# Patient Record
Sex: Female | Born: 1955 | Race: White | Hispanic: No | Marital: Married | Smoking: Never smoker
Health system: Southern US, Community
[De-identification: ages and names within clinical notes are randomized; demographics above are authoritative.]

## PROBLEM LIST (undated history)

## (undated) DIAGNOSIS — R9430 Abnormal result of cardiovascular function study, unspecified: Secondary | ICD-10-CM

## (undated) DIAGNOSIS — I2510 Atherosclerotic heart disease of native coronary artery without angina pectoris: Secondary | ICD-10-CM

## (undated) DIAGNOSIS — IMO0002 Reserved for concepts with insufficient information to code with codable children: Secondary | ICD-10-CM

## (undated) DIAGNOSIS — L409 Psoriasis, unspecified: Secondary | ICD-10-CM

## (undated) DIAGNOSIS — E785 Hyperlipidemia, unspecified: Secondary | ICD-10-CM

## (undated) DIAGNOSIS — N200 Calculus of kidney: Secondary | ICD-10-CM

## (undated) DIAGNOSIS — K9041 Non-celiac gluten sensitivity: Secondary | ICD-10-CM

## (undated) DIAGNOSIS — K317 Polyp of stomach and duodenum: Secondary | ICD-10-CM

## (undated) HISTORY — DX: Abnormal result of cardiovascular function study, unspecified: R94.30

## (undated) HISTORY — DX: Hyperlipidemia, unspecified: E78.5

## (undated) HISTORY — DX: Calculus of kidney: N20.0

## (undated) HISTORY — DX: Atherosclerotic heart disease of native coronary artery without angina pectoris: I25.10

## (undated) HISTORY — DX: Psoriasis, unspecified: L40.9

## (undated) HISTORY — DX: Non-celiac gluten sensitivity: K90.41

## (undated) HISTORY — DX: Polyp of stomach and duodenum: K31.7

## (undated) HISTORY — DX: Reserved for concepts with insufficient information to code with codable children: IMO0002

---

## 1997-06-26 ENCOUNTER — Other Ambulatory Visit: Admission: RE | Admit: 1997-06-26 | Discharge: 1997-06-26 | Payer: Self-pay | Admitting: Obstetrics and Gynecology

## 1997-07-10 ENCOUNTER — Ambulatory Visit (HOSPITAL_COMMUNITY): Admission: RE | Admit: 1997-07-10 | Discharge: 1997-07-10 | Payer: Self-pay | Admitting: Obstetrics and Gynecology

## 1997-11-15 ENCOUNTER — Ambulatory Visit: Admission: RE | Admit: 1997-11-15 | Discharge: 1997-11-15 | Payer: Self-pay | Admitting: Internal Medicine

## 1998-08-01 ENCOUNTER — Ambulatory Visit (HOSPITAL_COMMUNITY): Admission: RE | Admit: 1998-08-01 | Discharge: 1998-08-01 | Payer: Self-pay | Admitting: Obstetrics and Gynecology

## 1998-08-01 ENCOUNTER — Encounter: Payer: Self-pay | Admitting: Obstetrics and Gynecology

## 1999-11-12 ENCOUNTER — Other Ambulatory Visit: Admission: RE | Admit: 1999-11-12 | Discharge: 1999-11-12 | Payer: Self-pay | Admitting: Obstetrics and Gynecology

## 1999-11-12 ENCOUNTER — Encounter (INDEPENDENT_AMBULATORY_CARE_PROVIDER_SITE_OTHER): Payer: Self-pay | Admitting: Specialist

## 2001-03-08 ENCOUNTER — Inpatient Hospital Stay (HOSPITAL_COMMUNITY): Admission: EM | Admit: 2001-03-08 | Discharge: 2001-03-11 | Payer: Self-pay | Admitting: Emergency Medicine

## 2001-03-08 ENCOUNTER — Encounter: Payer: Self-pay | Admitting: Emergency Medicine

## 2001-03-08 ENCOUNTER — Encounter: Payer: Self-pay | Admitting: Internal Medicine

## 2001-03-23 ENCOUNTER — Emergency Department (HOSPITAL_COMMUNITY): Admission: EM | Admit: 2001-03-23 | Discharge: 2001-03-23 | Payer: Self-pay

## 2001-04-08 ENCOUNTER — Encounter: Admission: RE | Admit: 2001-04-08 | Discharge: 2001-04-08 | Payer: Self-pay | Admitting: Internal Medicine

## 2001-04-08 ENCOUNTER — Encounter: Payer: Self-pay | Admitting: Internal Medicine

## 2001-04-20 ENCOUNTER — Encounter (HOSPITAL_COMMUNITY): Admission: RE | Admit: 2001-04-20 | Discharge: 2001-07-19 | Payer: Self-pay | Admitting: Cardiology

## 2001-05-17 ENCOUNTER — Other Ambulatory Visit: Admission: RE | Admit: 2001-05-17 | Discharge: 2001-05-17 | Payer: Self-pay | Admitting: Obstetrics and Gynecology

## 2002-04-05 ENCOUNTER — Encounter: Payer: Self-pay | Admitting: Emergency Medicine

## 2002-04-05 ENCOUNTER — Observation Stay (HOSPITAL_COMMUNITY): Admission: EM | Admit: 2002-04-05 | Discharge: 2002-04-06 | Payer: Self-pay | Admitting: Emergency Medicine

## 2002-08-30 ENCOUNTER — Other Ambulatory Visit: Admission: RE | Admit: 2002-08-30 | Discharge: 2002-08-30 | Payer: Self-pay | Admitting: Obstetrics and Gynecology

## 2004-03-13 ENCOUNTER — Ambulatory Visit: Payer: Self-pay | Admitting: Internal Medicine

## 2005-06-25 ENCOUNTER — Ambulatory Visit: Payer: Self-pay | Admitting: Internal Medicine

## 2005-06-26 ENCOUNTER — Ambulatory Visit: Payer: Self-pay | Admitting: Internal Medicine

## 2005-07-14 ENCOUNTER — Ambulatory Visit: Payer: Self-pay | Admitting: Internal Medicine

## 2006-01-01 ENCOUNTER — Ambulatory Visit: Payer: Self-pay | Admitting: Internal Medicine

## 2006-01-14 ENCOUNTER — Ambulatory Visit: Payer: Self-pay | Admitting: Cardiology

## 2006-02-11 ENCOUNTER — Ambulatory Visit: Payer: Self-pay | Admitting: Cardiology

## 2006-03-16 ENCOUNTER — Ambulatory Visit: Payer: Self-pay | Admitting: Cardiology

## 2006-03-24 ENCOUNTER — Ambulatory Visit: Payer: Self-pay

## 2006-06-17 ENCOUNTER — Ambulatory Visit: Payer: Self-pay | Admitting: Cardiology

## 2006-07-18 ENCOUNTER — Emergency Department (HOSPITAL_COMMUNITY): Admission: EM | Admit: 2006-07-18 | Discharge: 2006-07-19 | Payer: Self-pay | Admitting: Emergency Medicine

## 2006-07-27 ENCOUNTER — Ambulatory Visit: Payer: Self-pay | Admitting: Cardiology

## 2006-07-27 LAB — CONVERTED CEMR LAB
AST: 23 units/L (ref 0–37)
Albumin: 3.6 g/dL (ref 3.5–5.2)
Alkaline Phosphatase: 109 units/L (ref 39–117)
Direct LDL: 126.3 mg/dL
Total Protein: 6.8 g/dL (ref 6.0–8.3)

## 2006-07-29 ENCOUNTER — Ambulatory Visit: Payer: Self-pay | Admitting: Internal Medicine

## 2006-07-29 ENCOUNTER — Telehealth (INDEPENDENT_AMBULATORY_CARE_PROVIDER_SITE_OTHER): Payer: Self-pay | Admitting: *Deleted

## 2006-07-30 ENCOUNTER — Encounter: Payer: Self-pay | Admitting: Internal Medicine

## 2006-07-30 DIAGNOSIS — H9190 Unspecified hearing loss, unspecified ear: Secondary | ICD-10-CM | POA: Insufficient documentation

## 2006-07-31 ENCOUNTER — Telehealth: Payer: Self-pay | Admitting: Internal Medicine

## 2006-07-31 ENCOUNTER — Encounter: Payer: Self-pay | Admitting: Internal Medicine

## 2006-07-31 ENCOUNTER — Encounter (INDEPENDENT_AMBULATORY_CARE_PROVIDER_SITE_OTHER): Payer: Self-pay | Admitting: *Deleted

## 2006-08-10 ENCOUNTER — Encounter: Payer: Self-pay | Admitting: Internal Medicine

## 2006-08-13 ENCOUNTER — Telehealth (INDEPENDENT_AMBULATORY_CARE_PROVIDER_SITE_OTHER): Payer: Self-pay | Admitting: *Deleted

## 2006-08-18 ENCOUNTER — Encounter: Admission: RE | Admit: 2006-08-18 | Discharge: 2006-08-18 | Payer: Self-pay | Admitting: Otolaryngology

## 2006-09-16 ENCOUNTER — Encounter: Payer: Self-pay | Admitting: Internal Medicine

## 2006-11-15 ENCOUNTER — Inpatient Hospital Stay (HOSPITAL_COMMUNITY): Admission: EM | Admit: 2006-11-15 | Discharge: 2006-11-19 | Payer: Self-pay | Admitting: Emergency Medicine

## 2006-11-15 ENCOUNTER — Ambulatory Visit: Payer: Self-pay | Admitting: Internal Medicine

## 2006-11-17 ENCOUNTER — Encounter: Payer: Self-pay | Admitting: Gastroenterology

## 2006-11-17 DIAGNOSIS — D131 Benign neoplasm of stomach: Secondary | ICD-10-CM | POA: Insufficient documentation

## 2006-11-17 DIAGNOSIS — D1399 Benign neoplasm of ill-defined sites within the digestive system: Secondary | ICD-10-CM | POA: Insufficient documentation

## 2006-11-17 DIAGNOSIS — D139 Benign neoplasm of ill-defined sites within the digestive system: Secondary | ICD-10-CM

## 2006-11-24 ENCOUNTER — Ambulatory Visit: Payer: Self-pay | Admitting: Gastroenterology

## 2006-12-08 ENCOUNTER — Ambulatory Visit: Payer: Self-pay | Admitting: Cardiology

## 2006-12-15 ENCOUNTER — Ambulatory Visit: Payer: Self-pay | Admitting: Internal Medicine

## 2006-12-18 ENCOUNTER — Ambulatory Visit (HOSPITAL_COMMUNITY): Admission: RE | Admit: 2006-12-18 | Discharge: 2006-12-18 | Payer: Self-pay | Admitting: Internal Medicine

## 2007-01-22 ENCOUNTER — Ambulatory Visit: Payer: Self-pay | Admitting: Cardiology

## 2007-03-23 DIAGNOSIS — N200 Calculus of kidney: Secondary | ICD-10-CM | POA: Insufficient documentation

## 2008-01-11 ENCOUNTER — Telehealth (INDEPENDENT_AMBULATORY_CARE_PROVIDER_SITE_OTHER): Payer: Self-pay | Admitting: *Deleted

## 2008-01-12 ENCOUNTER — Ambulatory Visit: Payer: Self-pay | Admitting: Cardiology

## 2008-03-07 ENCOUNTER — Telehealth (INDEPENDENT_AMBULATORY_CARE_PROVIDER_SITE_OTHER): Payer: Self-pay | Admitting: *Deleted

## 2008-04-18 ENCOUNTER — Ambulatory Visit: Payer: Self-pay | Admitting: Cardiology

## 2008-04-18 LAB — CONVERTED CEMR LAB
ALT: 29 units/L (ref 0–35)
AST: 25 units/L (ref 0–37)
Bilirubin, Direct: 0 mg/dL (ref 0.0–0.3)
Total CHOL/HDL Ratio: 4
Triglycerides: 124 mg/dL (ref 0.0–149.0)

## 2008-04-24 ENCOUNTER — Ambulatory Visit: Payer: Self-pay | Admitting: Family Medicine

## 2008-05-25 ENCOUNTER — Telehealth (INDEPENDENT_AMBULATORY_CARE_PROVIDER_SITE_OTHER): Payer: Self-pay | Admitting: *Deleted

## 2008-07-17 ENCOUNTER — Ambulatory Visit: Payer: Self-pay | Admitting: Internal Medicine

## 2008-07-17 DIAGNOSIS — L408 Other psoriasis: Secondary | ICD-10-CM

## 2008-07-17 DIAGNOSIS — Z85828 Personal history of other malignant neoplasm of skin: Secondary | ICD-10-CM

## 2008-09-21 ENCOUNTER — Encounter: Payer: Self-pay | Admitting: Cardiology

## 2008-09-25 ENCOUNTER — Telehealth (INDEPENDENT_AMBULATORY_CARE_PROVIDER_SITE_OTHER): Payer: Self-pay | Admitting: *Deleted

## 2008-11-22 ENCOUNTER — Encounter (INDEPENDENT_AMBULATORY_CARE_PROVIDER_SITE_OTHER): Payer: Self-pay | Admitting: *Deleted

## 2009-01-10 ENCOUNTER — Telehealth (INDEPENDENT_AMBULATORY_CARE_PROVIDER_SITE_OTHER): Payer: Self-pay | Admitting: *Deleted

## 2009-01-24 IMAGING — CR DG CHEST 2V
2 series · 2 of 2 positions shown · non-contrast
Comparison: None

CLINICAL DATA: Chest pain

CHEST - 2 VIEW:

[w chest pa]
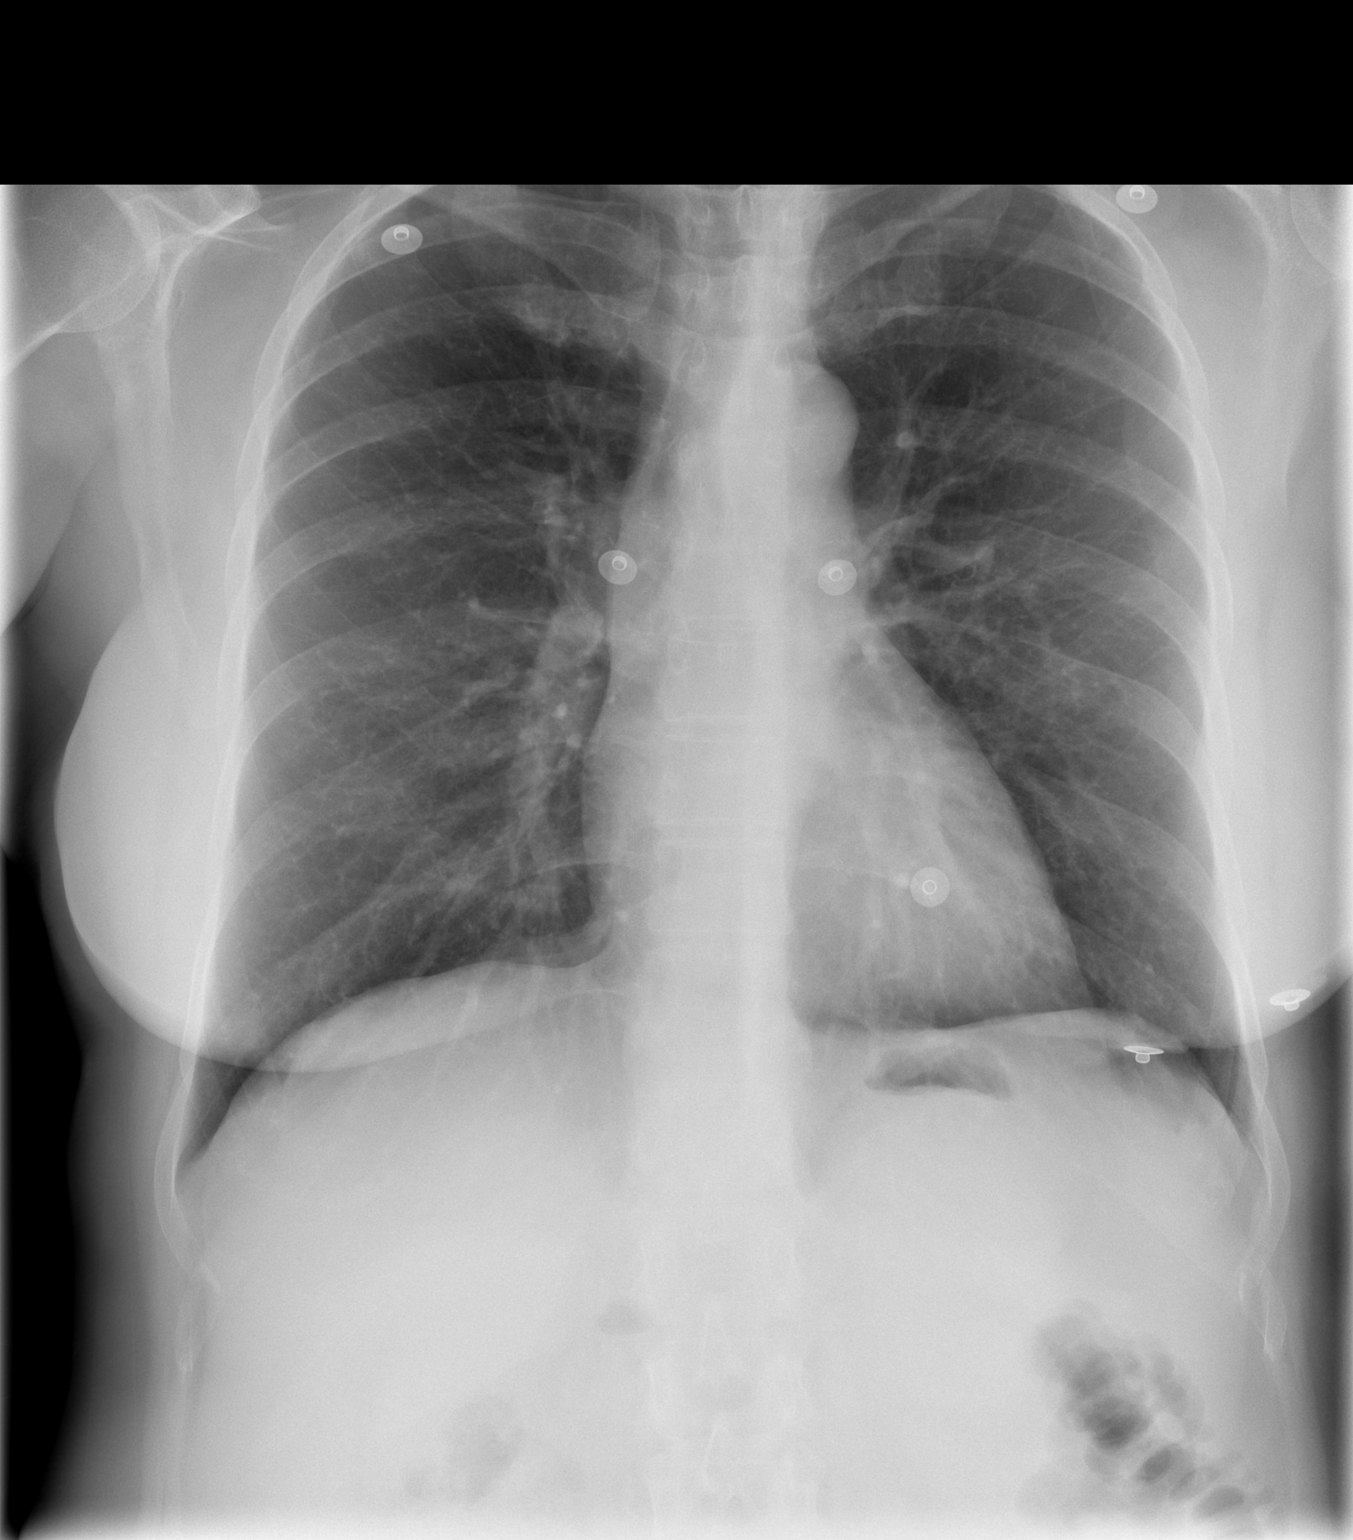

[w chest lat]
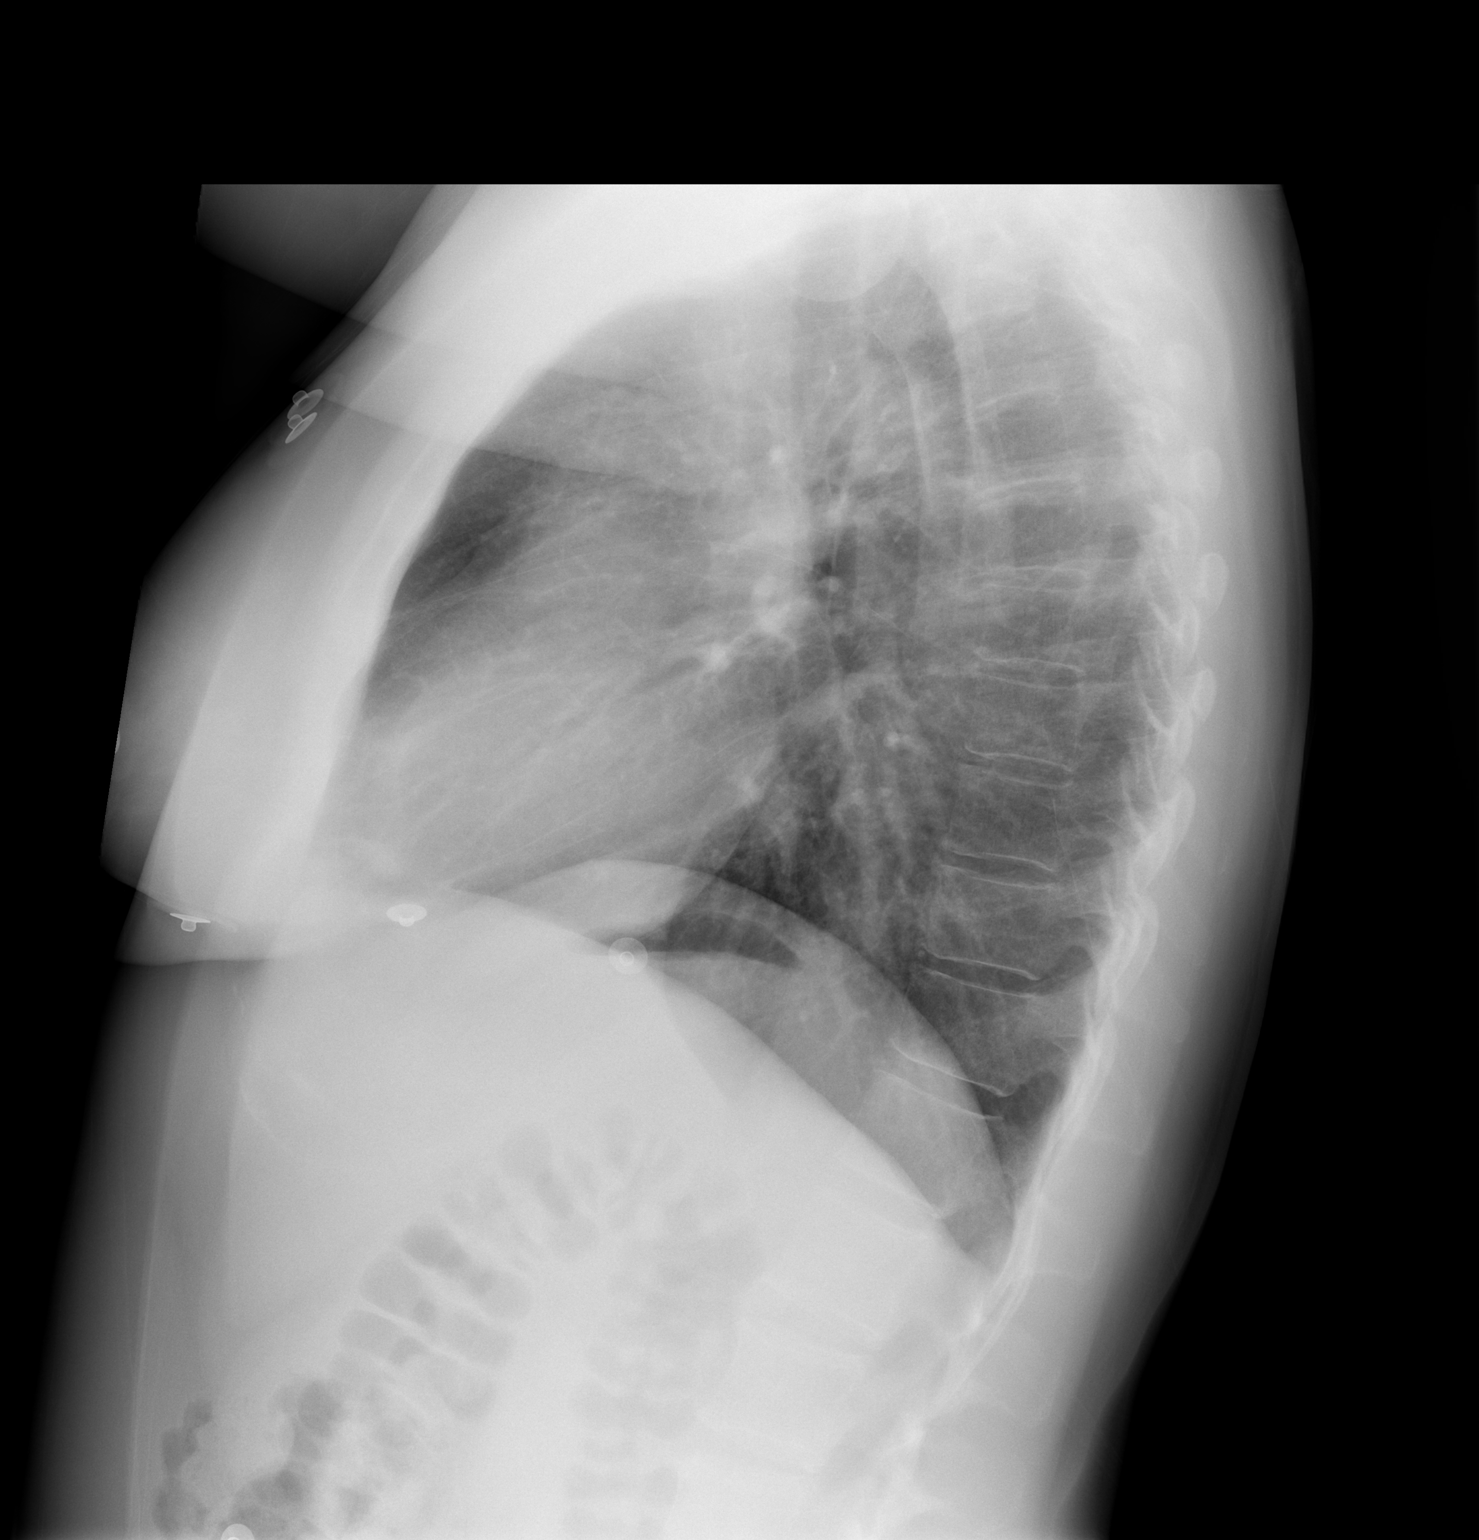

[2 of 2 positions shown; findings below may reference images not displayed]

FINDINGS: The heart size and mediastinal contours are within normal limits. 
Mild peribronchial thickening noted. No focal opacities. The visualized skeletal
structures are unremarkable.
IMPRESSION: Mild bronchitic changes.

## 2009-03-08 ENCOUNTER — Telehealth: Payer: Self-pay | Admitting: Cardiology

## 2009-03-08 ENCOUNTER — Telehealth (INDEPENDENT_AMBULATORY_CARE_PROVIDER_SITE_OTHER): Payer: Self-pay | Admitting: *Deleted

## 2009-03-13 ENCOUNTER — Ambulatory Visit: Payer: Self-pay | Admitting: Cardiology

## 2009-03-13 LAB — CONVERTED CEMR LAB
AST: 23 units/L (ref 0–37)
Alkaline Phosphatase: 111 units/L (ref 39–117)
Bilirubin, Direct: 0.2 mg/dL (ref 0.0–0.3)
LDL Cholesterol: 105 mg/dL — ABNORMAL HIGH (ref 0–99)
Total Bilirubin: 0.9 mg/dL (ref 0.3–1.2)
Total CHOL/HDL Ratio: 4
Total CK: 95 units/L (ref 7–177)
Total Protein: 7.2 g/dL (ref 6.0–8.3)

## 2009-03-20 ENCOUNTER — Encounter: Payer: Self-pay | Admitting: Cardiology

## 2009-03-21 ENCOUNTER — Ambulatory Visit: Payer: Self-pay | Admitting: Cardiology

## 2009-06-18 ENCOUNTER — Ambulatory Visit: Payer: Self-pay | Admitting: Internal Medicine

## 2009-06-25 ENCOUNTER — Ambulatory Visit: Payer: Self-pay | Admitting: Internal Medicine

## 2009-06-25 DIAGNOSIS — M949 Disorder of cartilage, unspecified: Secondary | ICD-10-CM

## 2009-06-25 DIAGNOSIS — M899 Disorder of bone, unspecified: Secondary | ICD-10-CM | POA: Insufficient documentation

## 2009-06-25 LAB — CONVERTED CEMR LAB
Basophils Absolute: 0 10*3/uL (ref 0.0–0.1)
Calcium: 9.4 mg/dL (ref 8.4–10.5)
Chloride: 100 meq/L (ref 96–112)
Eosinophils Relative: 2.7 % (ref 0.0–5.0)
GFR calc non Af Amer: 93.6 mL/min (ref >60)
Glucose, Bld: 91 mg/dL (ref 70–99)
Lymphs Abs: 1.9 10*3/uL (ref 0.7–4.0)
MCV: 96.2 fL (ref 78.0–100.0)
Monocytes Absolute: 0.6 10*3/uL (ref 0.1–1.0)
Neutrophils Relative %: 57.7 % (ref 43.0–77.0)
Platelets: 255 10*3/uL (ref 150.0–400.0)
RDW: 13.1 % (ref 11.5–14.6)
Sodium: 141 meq/L (ref 135–145)
TSH: 1.37 microintl units/mL (ref 0.35–5.50)

## 2009-07-18 ENCOUNTER — Encounter (INDEPENDENT_AMBULATORY_CARE_PROVIDER_SITE_OTHER): Payer: Self-pay | Admitting: *Deleted

## 2009-07-26 ENCOUNTER — Telehealth: Payer: Self-pay | Admitting: Cardiology

## 2009-10-17 ENCOUNTER — Ambulatory Visit: Payer: Self-pay | Admitting: Internal Medicine

## 2009-10-17 DIAGNOSIS — H6990 Unspecified Eustachian tube disorder, unspecified ear: Secondary | ICD-10-CM | POA: Insufficient documentation

## 2009-10-17 DIAGNOSIS — J069 Acute upper respiratory infection, unspecified: Secondary | ICD-10-CM | POA: Insufficient documentation

## 2009-10-17 DIAGNOSIS — H6980 Other specified disorders of Eustachian tube, unspecified ear: Secondary | ICD-10-CM

## 2009-10-17 LAB — CONVERTED CEMR LAB: Rapid Strep: NEGATIVE

## 2009-10-22 ENCOUNTER — Encounter: Payer: Self-pay | Admitting: Internal Medicine

## 2010-01-07 ENCOUNTER — Encounter: Payer: Self-pay | Admitting: Internal Medicine

## 2010-03-07 NOTE — Consult Note (Signed)
Summary: Adult [facility] Pediatric [facility]ol[facility]ology  Adult [facility] Pediatric Otolaryngology   Imported By: Lanelle Bal 11/01/2009 10:31:13  _____________________________________________________________________  External Attachment:    Type:   Image     Comment:   External Document

## 2010-03-07 NOTE — Assessment & Plan Note (Signed)
Summary: cpx/ns/kdc   Vital Signs:  Patient profile:   55 year old female Height:      65.75 inches Weight:      160.2 pounds BMI:     26.15 Temp:     98.5 degrees F oral Pulse rate:   72 / minute Resp:     16 per minute BP sitting:   120 / 76  (left arm) Cuff size:   large  Vitals Entered By: Shonna Chock (Jun 25, 2009 10:02 AM)  Comments REVIEWED MED LIST, PATIENT AGREED DOSE AND INSTRUCTION CORRECT    Primary Care Provider:  Alwyn Ren   History of Present Illness: [name] is here for a physical; she is basically asymptomatic except for occasional "stitch" LUQ. Dr Henrietta Hoover 02/16/2011OV & labs reviewed. LDL 105; goal = < 70.   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 Dorma Russell Skin cancer, hx of, Dr Danella Deis (no PMH of melanoma); Psoriasis EF 55-65% in 03/2001 Rodney Langton  Past Surgical History: PANENDOSCOPY 11/2006, Dr Yancey Flemings CARDIAC CATHETERIZATION 2003 & 2008 LEFT VENTRICULOGRAM 11/2006 ANGIO-SEAL 11/2006 R  SHOULDER SURGERY ? 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 Eyes:  Denies blurring, double vision, and vision loss-both eyes. ENT:  Complains of decreased hearing and ringing in ears; denies difficulty swallowing and hoarseness; See ENT PMH; symptoms significant on R; minimal tinnitus on L. CV:  Denies chest pain or discomfort, difficulty breathing at night, difficulty breathing while lying down, leg cramps with exertion, lightheadness, near fainting, palpitations, shortness of breath with exertion, swelling of feet, and swelling of hands. Resp:  Denies cough, shortness of breath, and sputum productive. GI:  Denies abdominal pain, bloody stools, change in bowel habits, constipation, dark tarry stools, diarrhea, excessive appetite, gas, indigestion, nausea, and vomiting. GU:  Denies discharge, dysuria, and hematuria. MS:  Denies joint pain, joint redness, and joint swelling. Derm:  Denies changes in nail beds, dryness, and lesion(s); Psoriasis . Neuro:  Denies disturbances in coordination, headaches, numbness, poor balance, sensation of room spinning, and tingling. Psych:  Denies anxiety, depression, easily angered, easily tearful, and irritability. Endo:  Denies cold intolerance, excessive hunger, excessive thirst, excessive urination, and heat intolerance. Heme:  Complains of abnormal bruising; denies bleeding; on Plavix. Allergy:  Denies itching eyes and sneezing; Congestion with Christmas tree exposure.  Physical Exam  General:  well-nourished; alert,appropriate and cooperative throughout examination Head:  Normocephalic and atraumatic without obvious abnormalities.  Eyes:  No corneal or conjunctival inflammation noted.Perrla. Funduscopic exam benign, without hemorrhages, exudates or papilledema.  Ears:  External ear exam shows no significant lesions or deformities.  Otoscopic examination  reveals clear canals,  tympanic membranes are intact bilaterally without bulging, retraction, inflammation or discharge. Hearing is grossly normal bilaterally. Nose:  External nasal examination shows no deformity or inflammation. Nasal mucosa are pink and moist without lesions or exudates. Mouth:  Oral mucosa and oropharynx without lesions or exudates.  Teeth in good repair. Neck:  No deformities, masses, or tenderness noted. Lungs:  Normal respiratory effort, chest expands symmetrically. Lungs are clear to auscultation, no crackles or wheezes. Heart:  Normal rate and regular rhythm. S1 and S2 normal without gallop, murmur, click, rub . S4 Abdomen:  Bowel sounds positive,abdomen soft and non-tender without masses, organomegaly or hernias noted. Genitalia:  Dr Gaye Alken Msk:  No deformity or scoliosis noted of thoracic or lumbar spine.   Pulses:  R and L carotid,radial,dorsalis pedis and posterior tibial pulses are full and equal bilaterally Extremities:  No clubbing, cyanosis, edema, or deformity noted with normal full range of motion of all joints.   Neurologic:  alert & oriented X3 and DTRs symmetrical and normal.   Skin:  Intact without suspicious lesions or rashes Cervical Nodes:  No lymphadenopathy noted Axillary Nodes:  No palpable lymphadenopathy Psych:  memory intact for recent and remote, normally interactive, and good eye contact.     Impression & Recommendations:  Problem # 1:  ROUTINE GENERAL MEDICAL EXAM@HEALTH  CARE FACL (ICD-V70.0)  Problem # 2:  HYPERLIPIDEMIA (ICD-272.4) as per Dr. Myrtis Ser Her updated medication list for this problem includes:    Simvastatin 20 Mg Tabs (Simvastatin) .Marland Kitchen... Take one tablet by mouth daily at bedtime  Problem # 3:  CORONARY ARTERY DISEASE (ICD-414.00) as per Dr. Myrtis Ser Her updated medication list for this problem includes:    Nitroglycerin 0.4 Mg Subl (Nitroglycerin) ..... One tablet under tongue every 5 minutes as needed for chest pain---may repeat times three     Plavix 75 Mg Tabs (Clopidogrel bisulfate) .Marland Kitchen... 1 by mouth qd    Triamterene-hctz 37.5-25 Mg Tabs (Triamterene-hctz) .Marland Kitchen... Take one tablet daily  Problem # 4:  PSORIASIS (ICD-696.1) as per Dr. Danella Deis  Problem # 5:  OSTEOPENIA (ICD-733.90) as per Dr Arelia Sneddon  Problem # 6:  LOSS, HEARING NOS (ICD-389.9) as per Dr Dorma Russell  Complete Medication List: 1)  Folic Acid 1 Mg Tabs (Folic acid) .... Take one tablet daily 2)  Nitroglycerin 0.4 Mg Subl (Nitroglycerin) .... One tablet under tongue every 5 minutes as needed for chest pain---may repeat times three 3)  Simvastatin 20 Mg Tabs (Simvastatin) .... Take one tablet by mouth daily at bedtime 4)  Plavix 75 Mg Tabs (Clopidogrel bisulfate) .Marland Kitchen.. 1 by mouth qd 5)  Triamterene-hctz 37.5-25 Mg Tabs (Triamterene-hctz) .... Take one tablet daily 6)  Calcium Carbonate-vitamin D 600-400 Mg-unit Tabs (Calcium carbonate-vitamin d) .... Once daily 7)  Alprazolam 0.5 Mg Tabs (Alprazolam) .... Rx'ed by dr.kraus, 1 by mouth as needed for tinnitus. usually once every two weeks 8)  Multivitamins Tabs (Multiple vitamin) .Marland Kitchen.. 1 by mouth once daily 9)  Melatonin 3 Mg Tabs (Melatonin) .... As needed 10)  Talconex Scalp  .... As needed  Patient Instructions: 1)  Your LDL goal = < 70

## 2010-03-07 NOTE — Letter (Signed)
Summary: Colonoscopy Letter  [facility]  [address] [place], [place] [address]   Phone: [phone]  Fax: [phone]      July 18, 2009 MRN: [id number]   [facility] [address] [place], [place]  [address]   Dear Ms. Sacra,   According to your medical record, it is time for you to schedule a Colonoscopy. The American Cancer Society recommends this procedure as a method to detect early colon cancer. Patients with a family history of colon cancer, or a personal history of colon polyps or inflammatory bowel disease are at increased risk.  This letter has been generated based on the recommendations made at the time of your procedure. If you feel that in your particular situation this may no longer apply, please contact our office.  Please call our office at [phone] to schedule this appointment or to update your records at your earliest convenience.  Thank you for cooperating with [place] to provide you with the very best care possible.   Sincerely,  Wilhemina Bonito. Marina Goodell, M.D.  [facility] [phone]

## 2010-03-07 NOTE — Progress Notes (Signed)
Summary: cpx labs  Phone Note Call from Patient   Caller: Patient Summary of Call: patient has a cpx appt on may 23,2011 and cpx labs scheduled on may 16,2011. Need lab order please. Initial call taken by: Barb Merino,  March 08, 2009 3:14 PM  Follow-up for Phone Call        BMP,CBCD,TSH,UDIP,STOOL CARDS V70.0  (Looks like other labs order by cardiologist) Follow-up by: Shonna Chock,  March 09, 2009 11:43 AM    Additional Follow-up for Phone Call Additional follow up Details #2::    labs are scheduled.Marland KitchenMarland KitchenBarb Merino  March 09, 2009 12:14 PM  Follow-up by: Barb Merino,  March 09, 2009 12:14 PM

## 2010-03-07 NOTE — Progress Notes (Signed)
Summary: refill   Phone Note Refill Request Message from:  Patient on July 26, 2009 2:20 PM  Refills Requested: Medication #1:  SIMVASTATIN 20 MG TABS Take one tablet by mouth daily at bedtime   Supply Requested: 3 months [facility]   Method Requested: Fax to [facility] Initial call taken by: Migdalia Dk,  July 26, 2009 2:20 PM    Prescriptions: SIMVASTATIN 20 MG TABS (SIMVASTATIN) Take one tablet by mouth daily at bedtime  #90 x 2   Entered by:   Hardin Negus, RMA   Authorized by:   Talitha Givens, MD, [facility]   Signed by:   Hardin Negus, RMA on 07/27/2009   Method used:   Faxed to ...       [facility] (mail-order)             , [place]         Ph: [phone]       Fax: [phone]   RxID:   [id number]

## 2010-03-07 NOTE — Letter (Signed)
Summary: [name] MD  [name] Bridget Doheny MD   Imported By: Lanelle Bal 01/24/2010 09:04:01  _____________________________________________________________________  External Attachment:    Type:   Image     Comment:   External Document

## 2010-03-07 NOTE — Assessment & Plan Note (Signed)
Summary: possible strep throat??//lch   Vital Signs:  Patient profile:   55 year old female Weight:      160.4 pounds BMI:     26.18 Temp:     98.8 degrees F oral Pulse rate:   72 / minute Resp:     14 per minute BP sitting:   120 / 78  (left arm) Cuff size:   large  Vitals Entered By: Shonna Chock CMA (October 17, 2009 12:06 PM) CC: Sore throat, achy, and chills, URI symptoms   Primary Care Provider:  Alwyn Ren  CC:  Sore throat, achy, and chills, and URI symptoms.  History of Present Illness: URI Symptoms      This is a 55 year old woman who presents with URI symptoms since 10/09/2009. Onset as fatigue followed by sinus  congestion & chills .  The patient reports L  earache( due to "fluid"), but denies purulent nasal discharge and productive cough.  Associated symptoms include low-grade fever (<100.5 degrees).  The patient denies dyspnea and wheezing.  The patient also reports frontal  headache.  The patient denies the following risk factors for Strep sinusitis: unilateral facial pain, tooth pain, and tender adenopathy.  Rx: Sudafed, nasal wash  Allergies (verified): No Known Drug Allergies  Physical Exam  General:  in no acute distress; alert,appropriate and cooperative throughout examination Ears:  External ear exam shows no significant lesions or deformities.  Otoscopic examination reveals dull L TM.  Hearing is grossly normal bilaterally. Nose:  External nasal examination shows no deformity or inflammation. Nasal mucosa are pink and moist without lesions or exudates. Mouth:  Oral mucosa and oropharynx without lesions or exudates.  Teeth in good repair. Lungs:  Normal respiratory effort, chest expands symmetrically. Lungs are clear to auscultation, no crackles or wheezes. 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 Take one tablet daily 2)  Nitroglycerin 0.4 Mg Subl (Nitroglycerin) .... One tablet under tongue every 5 minutes as needed for chest pain---may repeat times three 3)  Simvastatin 20 Mg Tabs (Simvastatin) .... Take one tablet by mouth daily at bedtime 4)  Plavix 75 Mg Tabs (Clopidogrel bisulfate) .Marland Kitchen.. 1 by mouth qd 5)  Triamterene-hctz 37.5-25 Mg Tabs (Triamterene-hctz) .... Take one tablet daily 6)  Calcium Carbonate-vitamin D 600-400 Mg-unit Tabs (Calcium carbonate-vitamin d) .... Once daily 7)  Alprazolam 0.5 Mg Tabs (Alprazolam) .... Rx'ed by dr.kraus, 1 by mouth as needed for tinnitus. usually once every two weeks 8)  Multivitamins Tabs (Multiple vitamin) .Marland Kitchen.. 1 by mouth once daily 9)  Melatonin 3 Mg Tabs (Melatonin) .... As needed 10)  Talconex Scalp  .... As needed 11)  Magnesium 300 Mg Caps (Magnesium) .Marland Kitchen.. 1 by mouth once daily 12)  Fluticasone Propionate 50 Mcg/act Susp (Fluticasone propionate) .Marland Kitchen.. 1 spray two times a day as needed 13)  Amoxicillin 500 Mg Caps (Amoxicillin) .Marland Kitchen.. 1 three times a day  Other Orders: Rapid Strep ([address])  Patient Instructions: 1)  Neti pot once daily as needed for congestion.Drink as much  NON dairy fluid as you can tolerate for the next few days.Stop decongestants. Plain Mucinex for thick secretions. Prescriptions: AMOXICILLIN 500 MG CAPS (AMOXICILLIN) 1 three times a day  #30 x 0   Entered and Authorized by:   Chrissie Noa  Hopper MD   Signed by:   Marga Melnick MD on 10/17/2009   Method used:   Faxed to ...       [facility]* (retail)       [address]       [place], [place]  [id number]       Ph: [phone]       Fax: [phone]   RxID:   [id number] FLUTICASONE PROPIONATE 50 MCG/ACT SUSP (FLUTICASONE PROPIONATE) 1 spray two times a day as needed  #1 x 5   Entered and Authorized by:   Marga Melnick MD   Signed by:   Marga Melnick MD on  10/17/2009   Method used:   Faxed to ...       [facility]* (retail)       [address]       [place], [place]  [id number]       Ph: [phone]       Fax: [phone]   RxID:   [id number]   Laboratory Results    Other Tests  Rapid Strep: negative

## 2010-03-07 NOTE — Miscellaneous (Signed)
  Clinical Lists Changes  Observations: Added new observation of PAST MED HX: GLUTEN INTOLERANCE , probable based on elimination protocol results NEPHROLITHIASIS (ICD-592.0) GASTRIC POLYP (ICD-211.1) CAD  cath..03/2001...stent to RCA  /   cath ..11/2006.Marland KitchenMarland Kitchen60% prox RCA....patent stent mid-RCA...medical RX...EF  50% HYPERLIPIDEMIA (ICD-272.4) BENIGN NEOPLASM OTH&UNSPEC SITE DIGESTIVE SYSTEM (ICD-211.9) GASTRITIS, CHRONIC (ICD-535.10) LOSS, HEARING NOS (ICD-389.9)....??Meniere type ???  Dr. Dorma Russell ??  undiagnosed   Skin cancer, hx of, Dr Danella Deis (no PMH of melanoma); Psoriasis EF 55-65%...echo.Marland KitchenMarland Kitchen2/2003  (03/20/2009 14:35)       Past History:  Past Medical History: GLUTEN INTOLERANCE , probable based on elimination protocol results NEPHROLITHIASIS (ICD-592.0) GASTRIC POLYP (ICD-211.1) CAD  cath..03/2001...stent to RCA  /   cath ..11/2006.Marland KitchenMarland Kitchen60% prox RCA....patent stent mid-RCA...medical RX...EF  50% HYPERLIPIDEMIA (ICD-272.4) BENIGN NEOPLASM OTH&UNSPEC SITE DIGESTIVE SYSTEM (ICD-211.9) GASTRITIS, CHRONIC (ICD-535.10) LOSS, HEARING NOS (ICD-389.9)....??Meniere type ???  Dr. Dorma Russell ??  undiagnosed   Skin cancer, hx of, Dr Danella Deis (no PMH of melanoma); Psoriasis EF 55-65%...echo.Marland KitchenMarland Kitchen2/2003

## 2010-03-07 NOTE — Assessment & Plan Note (Signed)
Summary: f1y/ gd  Medications Added SIMVASTATIN 20 MG TABS (SIMVASTATIN) Take one tablet by mouth daily at bedtime CALCIUM CARBONATE-VITAMIN D 600-400 MG-UNIT  TABS (CALCIUM CARBONATE-VITAMIN D) once daily FLONASE 50 MCG/ACT SUSP (FLUTICASONE PROPIONATE) once daily      Allergies Added: NKDA  Visit Type:  Follow-up Referring Provider:  Doloris Hall Primary Provider:  Alwyn Ren  CC:  CAD.  History of Present Illness: The patient is seen for followup of coronary disease.  She had been admitted to [facility], in October 2008.  After very careful discussion at that time she underwent catheterization.  She had moderate single-vessel disease.  Stent was patent the right coronary artery.  Since that time she's been taken off gluten and she's done well.  She has a significant problem with tinnitus and she is followed by Dr.Kraus.  She has not been having any significant chest pain.  Current Medications (verified): 1)  Folic Acid 1 Mg Tabs (Folic Acid) .... Take One Tablet Daily 2)  Nitroglycerin 0.4 Mg Subl (Nitroglycerin) .... One Tablet Under Tongue Every 5 Minutes As Needed For Chest Pain---May Repeat Times Three 3)  Simvastatin 10 Mg Tabs (Simvastatin) .... Take One Tablet Daily 4)  Plavix 75 Mg  Tabs (Clopidogrel Bisulfate) .Marland Kitchen.. 1 By Mouth Qd 5)  Triamterene-Hctz 37.5-25 Mg Tabs (Triamterene-Hctz) .... Take One Tablet Daily 6)  Calcium Carbonate-Vitamin D 600-400 Mg-Unit  Tabs (Calcium Carbonate-Vitamin D) .... Once Daily 7)  Alprazolam 0.5 Mg Tabs (Alprazolam) .... 1/2-2 Tablets At Bedtime 8)  Flonase 50 Mcg/act Susp (Fluticasone Propionate) .... Once Daily  Allergies (verified): No Known Drug Allergies  Past History:  Past Medical History: Last updated: 03/20/2009 GLUTEN INTOLERANCE , probable based on elimination protocol results NEPHROLITHIASIS (ICD-592.0) GASTRIC POLYP (ICD-211.1) CAD  cath..03/2001...stent to RCA  /   cath ..11/2006.Marland KitchenMarland Kitchen60% prox RCA....patent stent  mid-RCA...medical RX...EF  50% HYPERLIPIDEMIA (ICD-272.4) BENIGN NEOPLASM OTH&UNSPEC SITE DIGESTIVE SYSTEM (ICD-211.9) GASTRITIS, CHRONIC (ICD-535.10) LOSS, HEARING NOS (ICD-389.9)....??Meniere type ???  Dr. Dorma Russell ??  undiagnosed   Skin cancer, hx of, Dr Danella Deis (no PMH of melanoma); Psoriasis EF 55-65%...echo.Marland KitchenMarland Kitchen2/2003  Review of Systems       Patient denies fever, chills, headache, sweats, rash, change in vision.  She does have significant problems with tinnitus is noted.  She denies chest pain, shortness of breath, cough, nausea vomiting, urinary symptoms.  All other systems are reviewed and are negative.  Vital Signs:  Patient profile:   55 year old female Height:      65 inches Weight:      158 pounds BMI:     26.39 Pulse rate:   78 / minute BP sitting:   122 / 80  (left arm) Cuff size:   regular  Vitals Entered By: Hardin Negus, RMA (March 21, 2009 2:18 PM)  Physical Exam  General:  in general she is quite stable. Head:  head is atraumatic. Eyes:  no xanthelasma. Neck:  no jugular venous distention. Chest Wall:  no chest wall tenderness. Lungs:  lungs are clear.  Respiratory effort is nonlabored. Heart:  cardiac exam reveals S1-S2.  No clicks or significant murmurs. Abdomen:  abdomen is soft. Msk:  no musculoskeletal deformities. Extremities:  no peripheral edema. Skin:  no skin rashes  Psych:  patient is oriented to person time and place.  Affect is normal.   Impression & Recommendations:  Problem # 1:  CORONARY ARTERY DISEASE (ICD-414.00)  Her updated medication list for this problem includes:    Nitroglycerin 0.4 Mg Subl (  Nitroglycerin) ..... One tablet under tongue every 5 minutes as needed for chest pain---may repeat times three    Plavix 75 Mg Tabs (Clopidogrel bisulfate) .Marland Kitchen... 1 by mouth qd  Orders: EKG w/ Interpretation (93000) EKG is done and reviewed by me.  There are mild nonspecific ST-T wave abnormalities.  Cardiac status is stable.  No  further workup is needed.  Problem # 2:  HYPERLIPIDEMIA (ICD-272.4)  Her updated medication list for this problem includes:    Simvastatin 20 Mg Tabs (Simvastatin) .Marland Kitchen... Take one tablet by mouth daily at bedtime Labs are reviewed.  Patient's LDL is 107.  She is on 10 mg of simvastatin.  Historically she has been on low doses because of GI problems and other problems.  Now that she feels much better on a gluten free diet, I would like to see if we could use 20 mg of simvastatin.  She will give this a try and let us know.  Problem # 3:  * DECREASED CONCENTRATION The patient is asked if I think that her medicines are causing her to have decreased concentrating ability.  I told her that I was not aware that the meds that she is taking caused this problem.  Patient Instructions: 1)  Increase Simvastatin in 20mg  daily 2)  Follow up in 1 year

## 2010-03-07 NOTE — Progress Notes (Signed)
Summary: lab work before appt   Phone Note Call from Patient Call back at [facility] Phone [phone]   Caller: Patient Reason for Call: Talk to Nurse Details for Reason: pt wants to have lab work before appt with jk. Initial call taken by: Lorne Skeens,  March 08, 2009 3:18 PM  Follow-up for Phone Call        pt ask for muscle test, liver, lipid, ck sch for 2/8  Follow-up by: Meredith Staggers, RN,  March 08, 2009 4:30 PM

## 2010-06-18 NOTE — Assessment & Plan Note (Signed)
[facility]ALTHCARE                            CARDIOLOGY OFFICE NOTE   [name], [name]                        MRN:          [id number]  DATE:01/12/2008                            DOB:          01-17-1956    [name] is seen for cardiology followup.  She is actually doing well.  She has known coronary disease.  She had been admitted to [facility] on  November 17, 2006.  She had had chest pain.  After very careful  discussion, she underwent repeat catheterization at that time.  She had  moderate single-vessel disease with no high-grade lesion.  There was 50-  60% proximal right lesion and a patent stent in the mid right and 50%  distal lesion.  Ejection fraction was 50%.  She also had a GI evaluation  at that time.  The results are in my note of December 08, 2006.  Since  then, she has also been taken off of gluten and she says that her reflux  is greatly improved.   She does not have chest pain or shortness of breath.  She has had no  syncope or presyncope.   Since I have seen her last, she had an episode of acute loss of hearing  in her right ear.  She tells me that Dr. Dorma Russell has questions of  possibility of Meniere-like problem.  She is on low-dose prednisone.  Dr. Dorma Russell is working with Dr. Kellie Simmering concerning her care.  She had a  significant problem with tinnitus also.  She is going about full  activities.   The patient had been on simvastatin.  She stopped it on her own.  She  said that she was not feeling well, but cannot remember the specifics.  She is willing to retry it as I feel it is very important for her to be  on a statin if possible for her coronary disease.  If she does not feel  well, we will certainly stop it and we will follow her labs carefully.   ALLERGIES:  PAPER TAPE.   MEDICATIONS:  Plavix, folic acid, fish oil, prednisone 10 every other  day, alprazolam p.r.n., triamterene/hydrochlorothiazide, and Tums.   OTHER MEDICAL  PROBLEMS:  See the list below.   REVIEW OF SYSTEMS:  Today, she is feeling well.  She is not having any  GI or GU symptoms.  She has no fevers or chills.  She has no headaches  or skin rashes.  Her review of systems is negative.   PHYSICAL EXAMINATION:  VITAL SIGNS:  Blood pressure is 115/76 with a  pulse of 76.  GENERAL:  The patient is oriented to person, time, and place.  Affect is  normal.  HEENT:  Reveals no xanthelasma.  She has normal extraocular motions.  NECK:  There are no carotid bruits.  There is no jugular venous  distention.  LUNGS:  Clear.  Respiratory effort is not labored.  CARDIAC:  Reveals an S1 with an S2.  There are no clicks or significant  murmurs.  ABDOMEN:  Soft.  EXTREMITIES:  She has  no peripheral edema.   EKG reveals no change.  There are nonspecific ST-T wave changes.   PROBLEMS:  1. Coronary disease.  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  This is stable.  She had      a non-ST elevation MI in 2003 that was treated with a stent.  There      was no proof of spasm.  She then had followup echo in the fall of      2008 with a patent stent and 50-60% right coronary lesion.  She      needs no testing at this time.  2. Question of some type of undiagnosed connective tissue disorder      historically.  3. Ejection fraction 50-55%.  4. Hyperlipidemia.  We will try to get her back on a statin very      carefully at this time.  5. Difficulty taking aspirin in the past.  She is on Plavix long-term.      We may be able to retry aspirin now that her GI symptoms are      better.  6. 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  Dr. Dorma Russell is working      with Dr. Kellie Simmering and they are questioning the possibility of      Meniere-like illness.  She is on low-dose prednisone.  7. Low-dose prednisone therapy.  8. Gluten intolerance and she is off gluten.   We will try very carefully to place her on a statin.   I have discussed  all of her issues at great length with her today.     Luis Abed, MD, [facility]  Electronically Signed    JDK/MedQ  DD: 01/12/2008  DT: 01/12/2008  Job #: [id number]   cc:   Carolan Shiver, M.D.  Aundra Dubin, M.D.  Titus Dubin. Alwyn Ren, MD,FACP,FCCP

## 2010-06-18 NOTE — Assessment & Plan Note (Signed)
[facility] HEALTHCARE                            CARDIOLOGY OFFICE NOTE   KYMANI, LAURSEN                          MRN:          [id number]  DATE:11/18/2006                            DOB:          1955/04/12    Ms. [name] is currently hospitalized and being discharged from [facility]  today.  She has had chest pain in the hospital.  There has been very  careful attention to the fact that repeat catheterization should be  considered.  She had a dissection in the remote past by history  elsewhere, and she is extremely hesitant.  GI has seen her with a very  nice evaluation.  She did have endoscopy.  She did not have erosive  esophagitis, but Dr. Arlyce Dice questions that she may have NERD (non-  erosive reflux).  She now has a pH monitor in place.   I spoke with her this morning.  We discussed at length pros and cons of  proceeding with catheterization.  I made it clear to her that I thought  that we would get the most complete information at catheterization, but  I can never guarantee no risk with the procedure.  I explained that I am  comfortable with her going home with the esophageal probe in place, and  with early followup with me.  There have been extensive discussions with  all doctors and with the patient, and we are doing our best to not push  catheterization on her, and at the same time offer her optimal care.     Luis Abed, MD, [facility]  Electronically Signed    JDK/MedQ  DD: 11/18/2006  DT: 11/18/2006  Job #: [id number]   cc:   Luis Abed, MD, [facility]

## 2010-06-18 NOTE — Assessment & Plan Note (Signed)
[facility] HEALTHCARE                            CARDIOLOGY OFFICE NOTE   MARLETTE, CURVIN                        MRN:          [id number]  DATE:01/22/2007                            DOB:          Dec 01, 1955    Ms. Hobbins is doing well.  Her cardiac status is stable.  She is not  having cardiac symptoms.  See my last note of December 08, 2006.  The  patient has seen Dr. Marina Goodell since that time.  She is feeling well.  She  was going to be continued on Protonix on an empiric basis.  The Bravo  study did not show any reflux.  There is some question as to whether or  not this data can be affected if the patient was a proton pump inhibitor  at that time.  She is fine at this point.  When she sees Dr. Marina Goodell for  followup next she will ask him about this question.   PAST MEDICAL HISTORY:   ALLERGIES:  PAPER TAPE.   MEDICATIONS:  Plavix, Simvastatin, folic acid, fish oil.  The patient is  not on aspirin because she feels that this causes her symptoms including  question of pus from her rectum.   OTHER MEDICAL PROBLEMS:  See this below.   REVIEW OF SYSTEMS:  She is stable.   PHYSICAL EXAMINATION:  Blood pressure 135/82 with a pulse of 83.  The patient is oriented to person, time, and place.  Affect is normal.  HEENT:  Reveals no xanthelasma.  She has normal extraocular motion.  LUNGS:  Clear.  Respiratory effort is not labored.  CARDIAC:  Reveals S1 and S2.  There are no clicks or significant  murmurs.  EXTREMITIES:  She has no peripheral edema.   PROBLEMS:  1. Coronary disease with catheterization in 1992 with a dissection at      that time.  2. Non ST elevation in February 2003, treated with a stent.  There was      no proof of the spasm.  Her Cardiolites have been stable.  She was      admitted into the hospital in the early fall of 2008 with chest      pain and ultimately cath showed 50% to 60% right coronary lesion,      but no tight lesions, and there was  no need for intervention.  3. Question of some type of undiagnosed connective tissue disorder.  4. Ejection fraction 50 to 55% with inferior hypokinesis.  5. Hyperlipidemia.  6. Difficulty taking aspirin and therefore she is on Plavix.  7. Ongoing question of other symptomatology.   I will see her back in 6 months for cardiology followup here.     Luis Abed, MD, [facility]  Electronically Signed    JDK/MedQ  DD: 01/22/2007  DT: 01/22/2007  Job #: [id number]   cc:   Dr. Georganna Skeans. Marina Goodell, MD

## 2010-06-18 NOTE — Op Note (Signed)
[name], [name]                 ACCOUNT NO.:  [id number]   MEDICAL RECORD NO.:  [id number]          PATIENT TYPE:  AMB   LOCATION:  ENDO                         FACILITY:  [facility]   PHYSICIAN:  Barbette Hair. Arlyce Dice, MD,FACGDATE OF BIRTH:  1955-11-01   DATE OF PROCEDURE:  DATE OF DISCHARGE:  11/17/2006                               OPERATIVE REPORT   48 HOUR BRAVO PH STUDY:  It reads 48 hour pH study was done with the patient off medications.   On analysis of day #1, there were 28 total episodes of reflux, none  lasting more than 5 minutes.  Total pH time below 4.0 was 15 minutes,  12.0 in the upright position.  A total DeMeester score was 6.1 with  normal less than 14.72.  On day #2, there were 9 episodes of reflux.  No  reflux episode lasted grater than 5 minutes.  Total time of pH below 4.0  was 8 and DeMeester score was 2.0.  there was only 1 episode of chest  pain recorded and this did correlate with reflux episode.   IMPRESSION:  No significant acid reflux was noted during the 48 hour pH  study.      Barbette Hair. Arlyce Dice, MD,FACG  Electronically Signed     RDK/MEDQ  D:  11/24/2006  T:  11/24/2006  Job:  [id number]

## 2010-06-18 NOTE — Discharge Summary (Signed)
[name], [name]                 ACCOUNT NO.:  [id number]   MEDICAL RECORD NO.:  [id number]          PATIENT TYPE:  AMB   LOCATION:  ENDO                         FACILITY:  [facility]   PHYSICIAN:  Bevelyn Buckles. Bensimhon, MDDATE OF BIRTH:  May 24, 1955   DATE OF ADMISSION:  08/17/2006  DATE OF DISCHARGE:  08/19/2006                               DISCHARGE SUMMARY   PROCEDURES:  1. Panendoscopy.  2. Cardiac catheterization.  3. Coronary arteriogram.  4. Left ventriculogram.  5. Angio-Seal.   TIME OF DISCHARGE:  Forty minutes.   HOSPITAL COURSE:  [name] is a 55 year old female with a history of  coronary artery disease.  She came to the hospital with chest pain on  November 15, 2006, and was admitted for further evaluation.   Her cardiac enzymes were negative for MI.  A previous cath was  associated with dissection.  She also has a history of significant  reflux symptoms and a GI consult was called.   She was seen by Dr. Arlyce Dice who increased her Protonix to b.i.d.  He  ordered an EGD and stated that this was negative.  A Bravo pH sensor  would be placed.  An EGD was performed on November 17, 2006.  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  Final  pathology is pending.  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  A pH probe was placed.  She is to continue b.i.d.  Protonix while wearing the probe for 48 hours.   [name] discussed the situation with Dr. Myrtis Ser and her family  extensively.  Eventually, it was decided that cardiac catheterization  would be the best test to rule out progression of coronary artery  disease.   Cardiac catheterization was performed on November 19, 2006.  Showed a 50-  60% proximal RCA and a patent mid RCA stent with a 50% distal RCA  stenosis.  The left system had no significant lesions.  Her EF was 50%  with inferior hypokinesis.  Dr. Gala Romney evaluated the films and felt  that she had moderate single-vessel  coronary artery disease without a  high-grade lesion and low normal left ventricular function.  He  suspected her chest pain was noncardiac and felt she could be safely  discharged with close outpatient follow-up.  Post cath [name] was  ambulating without chest pain or shortness of breath and considered  stable for discharge with outpatient follow-up arranged.   DISCHARGE INSTRUCTIONS:  1. Her activity level to be increased gradually with no lifting for      week and no driving for 2 days.  2 . She is to stick to a low sodium, heart healthy diet.  1. She is to call our office for any problems with the cath site.  She      is to follow up with Dr. Myrtis Ser on November 4 at 10:15 and with Dr.      Marina Goodell on November 11 at 10:30.  She is to follow up Dr. Alwyn Ren as      needed.  She is to drop off the Bravo device at [facility] on October 16.   DISCHARGE MEDICATIONS:  1. Plavix 75 mg daily.  2. Vitamins daily.  3. Folic acid daily.  4. Prilosec 20 mg daily is on hold.  5. Simvastatin 20 mg daily.  6. Metoprolol 25 mg b.i.d.  7. Protonix 40 mg b.i.d., take greater than 30 minutes before meals on      an empty stomach.      Theodore Demark, PA-C      Bevelyn Buckles. Bensimhon, MD  Electronically Signed    RB/MEDQ  D:  11/19/2006  T:  11/20/2006  Job:  [id number]   cc:   Dr. Marina Goodell  Dr. Alwyn Ren

## 2010-06-18 NOTE — Assessment & Plan Note (Signed)
[facility]                         GASTROENTEROLOGY OFFICE NOTE   KAELEEN, ODOM                        MRN:          [id number]  DATE:12/15/2006                            DOB:          [birth year]    REFERRING PHYSICIAN:  Luis Abed, MD, [facility]   REASON FOR EVALUATION:  Chest pain, question GI cause.   HISTORY:  This is a 55 year old white female with a history of coronary  artery disease status post coronary artery stent placement,  hyperlipidemia, and kidney stones who presents today regarding possible  GI cause for chest pain. The patient was felt possibly to have reflux  disease as manifested by prior history of intermittent heartburn. She  reports now that she was admitted to the hospital about one month ago  with severe substernal chest pain that awoke her from sleep. She felt  that the pain was very similar to prior myocardial pain. The pain lasted  several hours. She went to the hospital and was admitted. She had  similar pain six months earlier which resolved in time. She was treated  with a GI cocktail during the recent hospitalization. EKG and cardiac  enzymes were apparently negative as was chest x-ray except for minimal  basilar atelectasis. She saw Dr. Myrtis Ser recently regarding the pain. She  does not have the pain any more frequently than stated. She did undergo  cardiac catheterization which apparently showed no significant  obstructive disease. During her hospitalization, she underwent upper  endoscopy which was essentially normal. She also underwent Bravo 48 hour  pH study off proton pump inhibitors. This was interpreted by Dr. Melvia Heaps as normal with no significant reflux. She did not have an  ultrasound. Currently, she feels well.   ALLERGIES:  No known drug allergies, but is ALLERGIC TO PAPER TAPE.   CURRENT MEDICATIONS:  1. Multivitamin.  2. Plavix.  3. Simvastatin.  4. Folic acid.  5. Protonix.  6.  Taclonec.  7. Fish oil.   FAMILY HISTORY:  No family history of gastrointestinal malignancy.   SOCIAL HISTORY:  The patient is married with two children. She lives  with her husband and son. 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   REVIEW OF SYSTEMS:  Non-contributory.   PHYSICAL EXAMINATION:  Well-appearing female in no acute distress. Blood  pressure 108/70, heart rate 64, weight is 164.4 pounds.  HEENT: Sclerae anicteric. Conjunctivae are pink. Oral mucosa intact.  LUNGS:  Are clear.  HEART: Is regular.  ABDOMEN: Soft without tenderness, mass or hernia.   IMPRESSION:  This is a 55 year old with documented coronary artery  disease who was recently admitted with severe chest pain reminiscent of  prior cardiac pain. She was off proton pump inhibitors on presentation.  Endoscopy was negative and a Bravo 48 hour pH study did not suggest  significant reflux. As such, it is difficult to know whether her pain  was indeed reflux induced. Certainly,  no overwhelming evidence to  support that diagnosis. Alternatively, could she have had coronary  artery spasm or some other noncardiac  cause. At this point, she is  asymptomatic and on medical therapy per Dr. Myrtis Ser. I asked her to  maintain herself on a proton pump inhibitor daily empirically. The only  other thought I had was possible biliary colic. Her liver tests were  essentially normal in the hospital except for an alkaline phosphatase of  129. As such,I have ordered an abdominal ultrasound to rule out  gallstones. If this is negative she will return to the general medical  care of Dr. Alwyn Ren.     Wilhemina Bonito. Marina Goodell, MD  Electronically Signed    JNP/MedQ  DD: 12/15/2006  DT: 12/15/2006  Job #: [id number]   cc:   Luis Abed, MD, [facility]  Titus Dubin. Alwyn Ren, MD,FACP,FCCP

## 2010-06-18 NOTE — Assessment & Plan Note (Signed)
[facility]ALTHCARE                            CARDIOLOGY OFFICE NOTE   [name], [name]                        MRN:          [id number]  DATE:01/12/2008                            DOB:          01-Jun-1955    ADDENDUM     Luis Abed, MD, [facility]     JDK/MedQ  DD: 01/12/2008  DT: 01/12/2008  Job #: [id number]   cc:   Aundra Dubin, M.D.  Titus Dubin. Alwyn Ren, MD,FACP,FCCP  Carolan Shiver, M.D.

## 2010-06-18 NOTE — Assessment & Plan Note (Signed)
[facility] HEALTHCARE                                 ON-CALL NOTE   [name], [name]                        MRN:          [id number]  DATE:07/29/2006                            DOB:          1955/07/27    TIME RECEIVED:  06:42 a.m.   TELEPHONE:  [phone]   Patient had sudden onset this morning of ringing in her right ear and of  complete loss of hearing in the right ear.  She is mildly nauseated and  mildly dizzy.  There is no pain involved, no cold or upper respiratory  symptoms involved.  She is a friend of Dr. Suzanna Obey, but has never  seen him as a patient.  She is considering seeing him later today,  versus seeing Dr. Alwyn Ren.   My response is to go ahead and see Dr. Jearld Fenton today, since this is an ENT  problem.  She can then follow up with Dr. Alwyn Ren after that.     [name]. Clent Ridges, MD  Electronically Signed    SAF/MedQ  DD: 07/29/2006  DT: 07/29/2006  Job #: [id number]   cc:   Titus Dubin. Alwyn Ren, MD,FACP,FCCP

## 2010-06-18 NOTE — Cardiovascular Report (Signed)
[name], [name]                 ACCOUNT NO.:  [id number]   MEDICAL RECORD NO.:  [id number]          PATIENT TYPE:  INP   LOCATION:  2899                         FACILITY:  [facility]   PHYSICIAN:  Bevelyn Buckles. Bensimhon, MDDATE OF BIRTH:  April 13, 1955   DATE OF PROCEDURE:  DATE OF DISCHARGE:  11/19/2006                            CARDIAC CATHETERIZATION   PRIMARY CARE PHYSICIAN:  Dr. Alwyn Ren.   CARDIOLOGIST:  Dr. Myrtis Ser.   INDICATIONS:  Ms. Steig is a 55 year old woman with a history of coronary  artery disease.  She apparently had a right coronary artery dissection  during catheterization in 1992 and then in 2003 experienced a non-ST  elevation myocardial infarction for which she underwent percutaneous  intervention on the right coronary artery.  She was admitted with  recurrent chest pain which was responsive to Maalox.  She ruled out for  myocardial infarction with serial cardiac markers.  She underwent EGD  and placement of a pH probe with no obvious reasons to explain her chest  pain.  After much deliberation, it was decided she would proceed with  cardiac catheterization.  This was done in the inpatient lab.   PROCEDURES PERFORMED:  1. Selective coronary angiography.  2. Left heart catheterization.  3. Left ventriculogram.  4. Angio-Seal femoral artery closure.   DESCRIPTION OF PROCEDURE:  The risks and indications of the procedure  were discussed in detail as well as alternatives.  Consent was signed  and placed on the chart.  A 5-French arterial sheath was placed in right  femoral artery using a modified Seldinger technique.  Standard catheters  including JL-4, JR-4 and angled pigtail were used for the procedure.  All catheter exchanges made over a wire.  There were no apparent  complications.   Central aortic pressure was 108/65 with a mean of 83.  LV pressure 92/9  with an EDP of 10.  There was no aortic stenosis on pullback.   Left main was long, was angiographically  normal.   LAD was a long vessel coursing to the apex.  It gave off several size  small diagonals.  It was angiographically normal.   Left circumflex gave off a large ramus branch and three small obtuse  marginals and two posterolaterals.  It was angiographically normal.   Right coronary artery was a dominant vessel.  It gave off a moderate to  large acute marginal branch, a moderate-sized PDA and a small  posterolateral.  There was a stent in the midsection which was widely  patent.  The RCA itself was diffusely diseased throughout, had  approximately 50-60% lesion proximally and diffuse 40-50% stenoses  distally.  There was no high-grade lesions.  Normal flow.   The left ventriculogram done in the RAO position showed ejection  fraction of about 50% with mild inferior basilar hypokinesis.   ASSESSMENT:  1. Moderate one-vessel coronary artery disease without high-grade      lesion as described above.  2. Low normal left ventricular ejection fraction.   PLAN/DISCUSSION:  Given the results of her catheterization, I suspect  her chest pain is noncardiac.  She  can be discharged home today as long  as her groin remains stable.  She will need aggressive risk factor  management including smoking cessation.      Bevelyn Buckles. Bensimhon, MD  Electronically Signed     DRB/MEDQ  D:  11/19/2006  T:  11/20/2006  Job:  [id number]   cc:   Alwyn Ren, M.D.  Luis Abed, MD, [facility]

## 2010-06-18 NOTE — Assessment & Plan Note (Signed)
[facility]ALTHC[facility]                            CARDIOLOGY OFFICE NOTE   Bridget, Forbes                        MRN:          [id number]  DATE:06/17/2006                            DOB:          [birth year]    [name] is back for cardiology followup.  She is tolerating Plavix.  She has had problems with constipation and diarrhea in the past.  She  seems stable at this point.  She does not tolerate aspirin well, but she  is not allergic to aspirin.  In my last note, I had said that we would  check a Myoview scan.  This study shows her old inferior scar.  There  may be some peri-infarct ischemia, but she is stable and does not need  any further approach to her coronary status at this time.  She does need  to be on a statin.  We have discussed this today and she is now willing  to try a statin.   PAST MEDICAL HISTORY:   ALLERGIES:  She is intolerant to many medicines with her GI symptoms.   MEDICATIONS:  Multivitamin, Metamucil, Prilosec and Plavix 75 mg daily.   OTHER MEDICAL PROBLEMS:  See the complete list on the note of January 14, 2006.   REVIEW OF SYSTEMS:  She is doing well and has no significant complaints.   PHYSICAL EXAM:  Weight is 166 with blood pressure 122/69 and a pulse of  72.  The patient is oriented to person, time and place and affect is  normal.  She has no xanthelasma.  She has normal extraocular motion.  There are no carotid bruits.  There is no jugular venous distention.  Lungs are clear.  Respiratory effort is not labored.  Cardiac exam  reveals an S1 with an S2.  There are no clicks or significant murmurs.  The abdomen is soft.  She has normal bowel sounds.  There is no  peripheral edema.  She has no musculoskeletal deformities.   No labs were done today.  As described above, she did have a Myoview,  dated March 24, 2006, and this showed old scar with some peri-infarct  ischemia.   The patient is doing well.  It is time  to start a statin.  We will start  this today at simvastatin 20 mg daily and then obtain labs in six weeks  and I will see her back in three months.     Luis Abed, MD, [facility]     JDK/MedQ  DD: 06/17/2006  DT: 06/17/2006  Job #: [id number]   cc:   Titus Dubin. Alwyn Ren, MD,FACP,FCCP

## 2010-06-18 NOTE — Assessment & Plan Note (Signed)
[name]                            CARDIOLOGY OFFICE NOTE   [name], [name]                        MRN:          [id number]  DATE:12/08/2006                            DOB:          Feb 05, 1955    [name] is here for cardiology followup.  She does have known coronary  artery disease.  She was admitted to [facility] on  November 17, 2006, and discharged on November 19, 2006.  She had chest  pain.  Enzymes were negative for a myocardial infarction.  There was an  extensive amount of communication between our doctors and the patient,  in an attempt to try to help her and proceed with diagnostic testing.  A  cardiac catheterization was recommended and at first she was very  worried about this.  She then decided to go ahead.  Fortunately, that  study went very well.  She had moderate single-vessel disease without a  high-grade lesion.  There was a 50%-60% proximal right with a patent mid-  right stent and a 50% distal right.  The ejection fraction was 50%.  It  was felt that her pain was not cardiac.  She had a very careful GI  evaluation.  She also received a 48-hour Bravo pH study.  There were 28  episodes of reflux, none lasting more than five minutes on day one.  The  total pH time below 4 was 15 minutes, 12 of which were in the upright  position.  She had a total DeMeester score of 6.1, which was in the  normal range.  On day two she had nine episodes of reflux; therefore  considering all of this, there was no significant acid reflux during the  48-hour period.   We sent her home on a beta blocker.  She feels poorly with this.  With  this in mind, I feel it would be best to stop the dose that has already  been decreased to 12.5 mg twice daily.   ALLERGIES:  PAPER TAPE.   CURRENT MEDICATIONS:  1. Multivitamin.  2. Plavix.  3. Simvastatin.  4. Folic acid.  5. Metoprolol (to be stopped).  6. Protonix 40 mg twice daily.   OTHER MEDICAL PROBLEMS:  See the list below.   REVIEW OF SYSTEMS:  Today, other than feeling the negative effects of  the beta blocker, she is doing well.   PHYSICAL EXAMINATION:  VITAL SIGNS:  Blood pressure 114/77, pulse 61.  GENERAL:  The patient is oriented to person, time and place.  Affect is  normal.  HEENT:  No xanthelasma.  She has normal extraocular motion.  NECK:  There are no carotid bruits.  There is no jugular venous  distention.  LUNGS:  Clear.  HEART:  Reveals an S1 and S2.  There are no clicks or significant  murmurs.  ABDOMEN:  Soft.  EXTREMITIES:  She has no peripheral edema.   LABORATORY DATA:  No labs are done today.   PROBLEMS:  1. A cardiac catheterization in 1992, with dissection at that time.  2. Non-ST elevation  in February 2003, treated with a stent.  There was      no proof of spasm.  Her Cardiolites since then have been stable.      Most recently she was admitted with chest pain and the      catheterization showed 50%-60% right coronary artery lesions, but      no tight lesions, and there was no need for intervention.  3. Question of some type of undiagnosed connective tissue disorder.  4. Ejection fraction is 50%-55% with some inferior hypokinesis.  5. Hyperlipidemia.  She does not tolerate medications well.  6. History of some right-sided abdominal pain in the past, minor      rectal bleeding, pruritus ani, irritated skin on the buttocks, pus      from her rectum with certain medications and shrimp.   Her most recent chest pain is resolved.  She does not tolerate beta  blockers.  No further workup or change in her medications, other than  stopping her beta blocker today.  I will see her back in followup.     Luis Abed, MD, [facility]  Electronically Signed    JDK/MedQ  DD: 12/08/2006  DT: 12/09/2006  Job #: [id number]   cc:   Titus Dubin. Alwyn Ren, MD,FACP,FCCP

## 2010-06-18 NOTE — H&P (Signed)
NAMEDOMIQUE, REARDON                 ACCOUNT NO.:  [id number]   MEDICAL RECORD NO.:  [id number]          PATIENT TYPE:  INP   LOCATION:  3707                         FACILITY:  [facility]   PHYSICIAN:  Bevelyn Buckles. Bensimhon, MDDATE OF BIRTH:  1955-09-19   DATE OF ADMISSION:  11/15/2006  DATE OF DISCHARGE:                              HISTORY & PHYSICAL   SUMMARY OF HISTORY:  Ms. Erichsen is a 55 year old white female who  presented to [facility] emergency room complaining of chest discomfort.   She stated on Friday evening she had woken up and noticed a muscle  soreness in her left chest.  She took Tylenol with relief within less  than an hour.  She denied any associated shortness of breath, nausea,  vomiting, diaphoresis, and gave the discomfort a 3 on a scale of 0/10.  She noted that she was tired Saturday.   This morning she woke up around 4 o'clock with similar symptoms, which  she describes as a grabbing in her left chest.  She had some slight  nausea but again did not notice any radiation, vomiting or diaphoresis  or shortness of breath.  She tried taking some aspirin and pieces of  nitroglycerin without relief.  She gave this a 5 on a scale of 0/10/  Her husband drove her to the emergency room.  She states it is similar  to the emergency room visit that she had in June 2006, which they told  her was esophageal spasm after the discomfort was relieved with GI  cocktail; however, she says it is different from usual heartburn that  she experiences when she eats or drinks high-acidic foods, which she  describes as an anterior chest burning.  She also states it is similar  to her hospitalization in February 2003 when she had her non-ST elevated  myocardial infarction and stenting.  Now in the emergency room after  receiving a GI cocktail, her discomfort is zero.  She thinks that there  is a slight pleuritic component; however, she denies any recent  accidents or injuries.   Past medical  history is notable for no known specific drug allergies.  However she has multiple GI intolerances to aspirin and some statins.   Medications prior to admission include:  1. Plavix 75 mg daily.  2. Multivitamin daily.  3. Simvastatin unknown dose q.h.s.   PAST MEDICAL HISTORY:  1. Hyperlipidemia.  2. Kidney stones.  3. Multiple GI she issues, followed by Dr. Marina Goodell.  4. C-section.  5. Right shoulder surgery.  6. Perianoplasty x2.  7. She has a history of coronary artery disease with a catheterization      in [place] in 1992 complicated by dissection, resulting in      myocardial infarction.  Non-ST elevated myocardial infarction in      February 2003.  Catheterization at that time showed an EF of 55,      inferior akinesis, 20% ramus, irregularities in the LAD, 30-40%      proximal RCA, 80% mid RCA.  She underwent bare metal stenting to  the mid RCA lesion.  Last echocardiogram was March 10, 2001,      which showed an EF of 55-65%, posterior hypokinesis, left      ventricular dilatation.  Last Myoview was in February 2008 that an      showed inferior scar with mild peri-infarct ischemia.  It is noted      that she had not followed up with Dr. Myrtis Ser since her emergency room      visit in June 2008.   SOCIAL HISTORY:  She resides in [place] with her husband and 16-year-  old son.  She has two children, both alive and well.  She is a  [employer] trader.  She denies any tobacco use.  She might have one to two  glasses of red wine a week.  Denies any drugs or herbal medications.  Maintains a low-fat diet.  She has not exercised in the last 2 weeks.  However, prior to that she enjoys walking and kayaking without  difficulty.   FAMILY HISTORY:  Her mother is alive at the age of 28 with a history of  GERD, her father at the age of 55 with a pacer, heart failure and  hypertension.  She has two brothers and one sister that are alive and  well.   REVIEW OF SYSTEMS:  In addition to the  above, notable for reading  glasses.  She does have bilateral hearing loss, right greater than left.  She is scheduled for hearing aids.  This occurred acutely earlier this  summer.  She also has a history of psoriasis.  Postmenopausal.  GERD  with occasional constipation.  All other points are unremarkable.   PHYSICAL EXAM:  GENERAL:  Well-nourished, well-developed, pleasant white  female in no apparent distress.  Husband is present.  Temperature is 98.3, blood pressure initially was 164/85, pulse 101,  respirations 20, 97% saturations on room air.  HEENT:  Unremarkable except for glasses.  NECK:  Supple without thyromegaly, adenopathy, JVD or carotid bruits.  CHEST:  Symmetrical excursion.  Clear to auscultation without rales,  rhonchi or wheezing.  HEART:  PMI is not displaced.  Regular rate and rhythm.  Do not  appreciate any murmurs, rubs, clicks or gallops.  SKIN:  Integument intact.  ABDOMEN:  Soft, bowel sounds present without organomegaly, masses or  tenderness.  EXTREMITIES:  Negative cyanosis, clubbing or edema.  All peripheral pulses are symmetrical and intact.  I do not appreciate  any abdominal or femoral bruits.  MUSCULOSKELETAL:  Intact.  NEUROLOGIC:  Intact.   Chest x-ray shows minimal bibasilar atelectasis.  EKG shows normal sinus  rhythm, normal axis, delayed R wave, normal intervals, nonspecific  changes.  No change from July 18, 2006.  CBC in the emergency room shows  H&H of 14.6 and 41.5, normal indices, platelets 269, WBC is 8.5, and I-  STAT shows a sodium of 120, potassium 3.3, BUN 17, creatinine 0.8,  glucose 105.  Point of care markers were negative x2.   IMPRESSION:  1. Chest discomfort of uncertain etiology, parts are suspicious for      cardiac component' however may gastrointestinal-related.  2. Hypertension.  3. Anxiety in regard to potential cardiac catheterization given      experience to 1992.  4. History as noted per past medical history.  5.  Hypokalemia.   DISPOSITION:  Dr. Gala Romney reviewed the patient's history, spoke with  and examined the patient.  He spent considerable time speaking with her  and her husband in  regards to further evaluation of her discomfort.  At  this point the patient is very reluctant for re-catheterization.  We  will admit for observation to rule out myocardial  infarction.  If enzymes are negative and without recurring chest  discomfort, she could be discharged in the morning with an outpatient  stress Myoview and follow-up with Dr. Myrtis Ser.  We will continue proton  pump inhibitor as well as add beta blocker and continue her home  medications.      Joellyn Rued, PA-C      Bevelyn Buckles. Bensimhon, MD  Electronically Signed    EW/MEDQ  D:  11/15/2006  T:  11/16/2006  Job:  [id number]   cc:   Dr. Princella Ion, MD, [facility]  Wilhemina Bonito. Marina Goodell, MD

## 2010-06-18 NOTE — Assessment & Plan Note (Signed)
[facility]                                 ON-CALL NOTE   HARUMI, YAMIN                        MRN:          [id number]  DATE:07/18/2006                            DOB:          [birth year]    DATE OF INTERACTION:  July 18, 2006, at 9:21 p.m.   PHONE NUMBER:  [phone].   OBJECTIVE:  Patient complained of heartburn.  She has had 2 MIs in the  past, says they were both very subtle.  Wants me to okay her to take  proton pump inhibitor and go on her trip tomorrow where she is going to  [place].  She complains of spasms below the breast bone, started this  afternoon at a baseball game.  She does not think she had lunch but had  breakfast earlier.  No real tomato products or spicy foods were taken.  Does not sound as if this pain is exertional but the patient really  wants me to give her permission to do what she wants to do.   ASSESSMENT:  Chest pain, probable heartburn but cannot rule out  myocardial infarction.   PLAN:  I told her it is very difficult when seeing the patient, let  alone over the phone, to tell somebody for sure that they are not having  a heart attack.  Suggest that she do try Prilosec, as she is planning,  and if symptoms do not improve quickly would have her go to the  emergency room and be checked out for MI.   PRIMARY CARE [place] Schlink:  Dr. Alwyn Ren.   HOME OFFICE:  [facility].     Arta Silence, MD  Electronically Signed    RNS/MedQ  DD: 07/18/2006  DT: 07/19/2006  Job #: [id number]

## 2010-06-21 NOTE — Cardiovascular Report (Signed)
[place]. [facility]  Patient:    [name], [name] Visit Number: [id number] MRN: [id number]          Service Type: MED Location: [facility] [id number] Attending Physician:  Carrie Mew Dictated by:   Daisey Must, M.D. [facility] Proc. Date: 03/09/01 Admit Date:  03/08/2001   CC:         Titus Dubin. Alwyn Ren, M.D. [facility]  Luis Abed, M.D. [facility]  Cardiac Catheterization Laboratory   Cardiac Catheterization  PROCEDURES PERFORMED: 1. Left heart catheterization with coronary angiography and left    ventriculography. 2. Percutaneous transluminal coronary angiography with stent placement    in the mid right coronary artery with intravascular ultrasound guidance.  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  She was admitted to the hospital yesterday with symptoms of chest pain and ruled in for a non-Q wave myocardial infarction.  She was subsequently referred for cardiac catheterization.  CATHETERIZATION PROCEDURAL NOTE:  A 6-French sheath was placed in the right femoral artery.  Standard Judkins 6-French catheters were utilized.  Contrast was Omnipaque.  There were no complications.  RESULTS:  HEMODYNAMICS:  Left ventricular pressure 130/10, aortic pressure 120/80. There was no aortic valve gradient.  LEFT VENTRICULOGRAM:  There was mild akinesis of the inferior wall and base in the mid ventricular level.  Ejection fraction calculated at 55%.  There is no mitral regurgitation.  CORONARY ARTERIOGRAPHY (RIGHT DOMINANT): Left main is normal.  Left anterior descending artery has only minor luminal irregularities.  Left circumflex gives rise to a very large branching ramus intermedius and three small obtuse marginal branches.  In the more medial branch of the ramus intermedius there are luminal  irregularities.  In the more lateral branch of the ramus intermedius, there is a focal 20% stenosis.  The right coronary artery is a relatively small codominant vessel.  There is diffuse ectasia in the mid vessel.  In the proximal vessel there is an irregular 30-40% stenosis.  In the mid vessel, there is a discrete 80% stenosis within the ectatic segment of the vessel.  The distal vessel is small in caliber and has diffuse luminal irregularities.  The distal right coronary artery gives rise to a large acute marginal which supplies a portion of the inferior septum and a small to normal sized posterior descending artery.  IMPRESSIONS: 1. Mildly decreased left ventricular systolic function. 2. One-vessel coronary artery disease involving the right coronary artery, as    described.  There is significant diffuse ectasia in the vessel with a focal    80% stenosis.  PLAN:  These images were reviewed with Dr. Juanda Chance.  We feel that the best option is percutaneous intervention.  PERCUTANEOUS TRANSLUMINAL CORONARY ANGIOGRAPHY PROCEDURAL NOTE:  Following the completion of diagnostic catheterization, we proceeded with percutaneous coronary intervention.  The preexisting 6-French sheath in the right femoral artery was exchanged over a wire for a 7-French sheath.  The patient had been started on Integrilin on admission and this was continued through the procedure.  Heparin was administered to maintain an ACT of greater than 200 seconds.  We used a 7-French JR-4 guiding catheter with side-holes and a BMW wire.  We deployed a 4.0 x 8 mm Express-II stent at a deployment pressure of 14 atm.  This stent was then postdilated with the same stent delivery balloon to 18  atm.  We then performed intravascular ultrasound using an Atlantis ultrasound catheter to ensure adequate deployment of the stent.  This revealed that the distal portion of the stent was well apposed to the vessel wall and  appropriately sized, however, the most proximal portion of the stent was not completely apposed with the most proximal strut of the stent being unapposed to the vessel wall.  We, therefore, went back with a 5.0 x 8 mm Quantum balloon and inflated this to 16 atm and then 18 atm.  We repeated the intravascular ultrasound and this revealed improvement, however, there was what appeared to be a single strut which was not fully apposed to the vessel.  At that point, the vessel diameter measured 4.5 x 5.3 mm.  We, therefore, went back with the 5.0 x 8 mm Quantum balloon, inflating it to 20 atm in the proximal aspect of the stent.  Final angiographic images at that point revealed patency of the stent with what appeared to be adequate deployment and 0% residual stenosis at the lesion site.  However, just distal to the stented area there was the appearance of an intraluminal filling defect, consistent with probable thrombus.  At that point, the coronary guide wire had been removed.  We, therefore, readvanced the coronary guide wire and advanced a 3.0 x 12 mm Quantum balloon over the wire with the intention of treating the area of filling defect.  However, after these devices were advanced into the coronary, the filling defect appeared to have resolved.  We, therefore, removed the balloon and additional angiographic images revealed that the filling defect in the mid right coronary artery was completely resolved.  There was, however, a very small residual filling defect in the very distal aspect of a small terminal branch of the posterior descending artery.  At that point, we opted to discontinue the procedure as this did not appear to be clinically significant.  The patient had mild chest discomfort which subsequently resolved by the end of the procedure.  COMPLICATIONS:  None.  RESULTS:  Successful percutaneous transluminal coronary angiography with stent  placement in the mid right coronary  artery utilizing intravascular ultrasound guidance.  An 80% stenosis was reduced to 0% residual with TIMI-3 flow.  PLAN:  Integrilin will be continued for an additional 24 hours.  Plavix will be administered for the recommended time period of six to twelve months. Dictated by:   Daisey Must, M.D. [facility] Attending Physician:  Carrie Mew DD:  03/09/01 TD:  03/10/01 Job: [id number]

## 2010-06-21 NOTE — Assessment & Plan Note (Signed)
[facility]                            CARDIOLOGY OFFICE NOTE   [name], [name]                        MRN:          [id number]  DATE:03/16/2006                            DOB:          [birth year]    [name] is back for followup. We decided to use Plavix instead of  aspirin. She is doing well and this is good for her. She is feeling  better and her GI status is more stable.   PAST MEDICAL HISTORY:   ALLERGIES:  PATIENT IS INTOLERANT TO MANY MEDICINES WITH GI SYMPTOMS.   MEDICATIONS:  1. Multivitamin.  2. Metamucil.  3. Prilosec.  4. Plavix 75.   OTHER MEDICAL PROBLEMS:  See the prior list on my note of January 14, 2006.   REVIEW OF SYSTEMS:  She is feeling better today and her review of  systems is negative.   PHYSICAL EXAMINATION:  Blood pressure is 108/72, with a pulse of 80. Her  weight is 168 pounds. The patient is oriented to person, time, and  place, and her affect is normal.   Problems are listed extensively on my note of January 14, 2006.   Patient and I discussed the use of Plavix. She does not have a drug-  eluting stent. We are using it only as a replacement for aspirin.  Therefore if it needs to be held it can be at appropriate time.   PROBLEMS:  1. Coronary disease as outlined previously.  2. Hyperlipidemia. I would like to get her back on a statin at some      point. First we will do an exercise test to reassess her overall      cardiac status.     Luis Abed, MD, [facility]  Electronically Signed    JDK/MedQ  DD: 03/16/2006  DT: 03/16/2006  Job #: [id number]   cc:   Titus Dubin. Alwyn Ren, MD,FACP,FCCP

## 2010-06-21 NOTE — Assessment & Plan Note (Signed)
[facility]                            CARDIOLOGY OFFICE NOTE   [name], [name]                        MRN:          [id number]  DATE:02/11/2006                            DOB:          [birth year]    [name] is seen for cardiology followup.  See my note of January 14, 2006.  Because of known coronary disease, I have been trying to get her  back on her cardiac medications.  She has significant, unusual GI  symptoms.  However, she does think that aspirin causes constipation and  this causes her to have other problems with her GI track.  She is not  having any significant chest pain.  Dr. Marina Goodell had stated Metamucil and  Prilosec.  She says that the Prilosec definitely helps her.  She has  asked me if she can use Colace in addition to Metamucil.  I told her I  thought this would be okay, but we will make one change at a time.  It  seems that she has significant symptoms from aspirin.  Plavix would be a  reasonable alternative for her and therefore we will start her on  Plavix.   PAST MEDICAL HISTORY:  Allergies:  There are many medications that cause  GI symptoms.  This may well include statins and aspirin.   MEDICATIONS:  Multivitamin, Metamucil and Prilosec.   OTHER MEDICAL PROBLEMS:  See the complete list on my note of January 14, 2006.   REVIEW OF SYSTEMS:  See the HPI, otherwise review of systems is  negative.   PHYSICAL EXAMINATION:  Blood pressure is 145/88.  Her pulse is 90.  The  patient is oriented to person, time and place and her affect is normal.  LUNGS:  Clear, respiratory effort is not labored.  CARDIAC:  S1, S2.  She has no clicks or significant murmurs.  There is  no xanthelasmas.  She has normal extraocular motion.  There are no  carotid bruits.  There is no jugular venous distention.  She has no  peripheral edema.   Problems are listed on my note of January 14, 2006.  The patient's  coronary disease is stable.  We will  put her on Plavix as she appears  not to be able to take aspirin.  Problem No. 6:  GI symptoms.  The  patient feels that aspirin makes her constipated and at the same time,  she has some time of liquid discharge that causes her to have rashes.  I  cannot explain this but this causes her significant problems.  Therefore, we will stop aspirin and see if she can tolerate Plavix.     Bridget Abed, MD, [facility]  Electronically Signed    JDK/MedQ  DD: 02/11/2006  DT: 02/11/2006  Job #: [id number]   cc:   [name]. Bridget Ren, MD,FACP,FCCP

## 2010-06-21 NOTE — H&P (Signed)
[name], RAMAKER NO.:  [id number]   MEDICAL RECORD NO.:  [id number]                   PATIENT TYPE:  EMS   LOCATION:  MAJO                                 FACILITY:  [facility]   PHYSICIAN:  Olga Millers, M.D. [facility]            DATE OF BIRTH:  [birth year]   DATE OF ADMISSION:  04/05/2002  DATE OF DISCHARGE:                                HISTORY & PHYSICAL   HISTORY OF PRESENT ILLNESS:  The patient is a 55 year old female with a past  medical history of coronary disease and hyperlipidemia, who presents with  chest pain.  The patient states that she had a myocardial infarction in 1992  from a dissection in a coronary artery.  She did not receive intervention at  that time.  She had a non-Q-wave myocardial infarction February 2003.  At  that time, the cardiac catheterization revealed an ejection fraction of 55%  with mild inferior akinesis.  There was an 80% right coronary artery, and  the remaining disease was not obstructive.  She had PCI of the right  coronary at that time.  Since then, she has done well with no exertional  chest pain, dyspnea on exertion, orthopnea, PND, pedal edema, palpitations,  or syncope.  This past Saturday, the patient states that she was eating an  almond and aspirated the food.  She subsequently had a severe coughing  episode.  She subsequently developed pain in the occipital area.  She has  seen Titus Dubin. Alwyn Ren, M.D. about this, and he has given her a muscle  relaxer.  This pain eventually resolved.  However, today, she developed left  upper chest pain that was described as both sharp and tight.  There was no  associated nausea, vomiting, shortness of breath, or diaphoresis. The pain  did increase with inspiration.  It was not like her previous myocardial  infarction pain, as that pain was in the center of her chest.  The pain also  increases with sitting up, and there is some relief with sitting back.  She  states that  there was some mild improvement with nitroglycerin in the ER,  but the pain persists.   HOME MEDICATIONS:  Her medications at home include Toprol, folate, Zocor,  aspirin, multivitamin, fish oil, and sublingual nitroglycerin p.r.n.   ALLERGIES:  She has no known drug allergies.   SOCIAL HISTORY:  She does not smoke and occasionally consumes alcohol.   FAMILY HISTORY:  Negative for coronary artery disease.   PAST MEDICAL HISTORY:  1. There is no diabetes mellitus or hypertension, but there is     hyperlipidemia.  2. She has had coronary artery disease, as outlined above.  3. She has had perineoplasty x 2.  4. She has had an ectopic pregnancy as well as shoulder surgery.  5. She has had previous C-section.   REVIEW OF SYSTEMS:  She denies any headaches at  present.  There are no  fevers, chills, or productive cough.  There is no hemoptysis.  There is no  dysphagia, odynophagia, nor hematochezia.  There is no dysuria or hematuria.  There is no rash or seizure activity.  There is no orthopnea, PND, or  __________.  The remaining systems are negative.  Of note, she has not had  any recent travel or leg injury.   PHYSICAL EXAMINATION:  VITAL SIGNS:  Blood pressure 102/72, pulse 81,  respiratory rate 18.  GENERAL:  She is well-developed, well-nourished, in no acute distress.  Her  skin is warm and dry.  She does not appear to be depressed.  There is no  peripheral clubbing.  HEENT:  Unremarkable with no __________ .  NECK:  Supple and normal upstrokes bilaterally, and there are no bruits  noted.  There is no jugular venous distension.  CHEST:  Clear to auscultation on expansion.  CARDIOVASCULAR:  Regular rate and rhythm, normal S1 and S2.  There are no  murmurs, rubs, or gallops noted.  ABDOMEN:  Nontender, nondistended, positive bowel sounds, no  hepatosplenomegaly, no masses appreciated.  There is no abdominal bruit.  She does not have pain to palpation in the left upper chest.  She  has 2+  normal pulses bilaterally and no bruits  EXTREMITIES:  No edema, and I can palpate no cords.  She has 2+ dorsalis  pedis pulses bilaterally.  NEUROLOGIC:  Grossly intact.   LABORATORY DATA:  Her chest x-ray shows no acute disease.  Her  electrocardiogram shows a sinus rhythm at a rate of 86.  The axis is normal.  There are minor, nonspecific ST changes.  Her hemoglobin is 13.4 with a  hematocrit of 38.5.  Her white blood cell count is 7.9.  Her platelet count  is 244.  Her potassium is 3.6.  Her BUN and creatinine are 22 and 1.1.   DIAGNOSES:  1. Atypical chest pain.  2. History of hyperlipidemia.  3. History of coronary artery disease, status post PCI of the right coronary     artery.   PLAN:  Ms. Qazi presents with chest pain.  It is very atypical for ischemia.  It does increase with inspiration and with certain movements.  I wonder if  it may be musculoskeletal in etiology.  Initial electrocardiogram shows  nonspecific ST changes, and her initial enzymes are pending.  We will plan  to continue with her home medications including her aspirin and beta  blockade.  We will cycle enzymes, and we will obtain the results of her  recent Cardiolite from the office.  If her enzymes are negative and her  recent Cardiolite showed no ischemia, then I would not pursue further  cardiac work-up.  We will add a nonsteroidal for possible musculoskeletal  pain.  I will check a D-dimer, although I think pulmonary embolus is  unlikely.  Of note, her chest x-ray showed no pneumothorax, and there were  no infiltrates noted.  We will make further recommendations once we have the  above information.                                               Olga Millers, M.D. [facility]    BC/MEDQ  D:  04/05/2002  T:  04/05/2002  Job:  [id number]

## 2010-06-21 NOTE — Assessment & Plan Note (Signed)
[facility]                         GASTROENTEROLOGY OFFICE NOTE   Bridget, Forbes                        MRN:          [id number]  DATE:01/01/2006                            DOB:          12/07/1955    The patient is self-referred.   REASON FOR CONSULTATION:  1. Sensitive rectum.  2. Heartburn.  3. Constipation.   HISTORY:  This is a 55 year old white female with history of coronary  artery disease status post coronary artery stent placement,  hyperlipidemia, kidney stones, and pruritus ani who refers herself  regarding the above listed issues.  First, the patient complains of a  sensitive rectum.  She notices cracking in the midline between the  buttock cheeks.  As well, irritation of the skin with itching.  She did  try hydrocortisone cream that she states we recommended.  However, we  actually recommended Desitin.  She states the cream helped at first but  later made things worse.  She reports constipation related to certain  medications like Vytorin.  Off medication, bowel habits are reasonable.  She did undergo complete colonoscopy July 28, 2001.  This was entirely  normal.  Finally problems with heartburn for about a year.  Dr. Alwyn Ren  placed her on ranitidine b.i.d. which she says helps.  She has had no  dysphagia, no weight loss, no abdominal pain.  No GI bleeding.   PAST MEDICAL HISTORY:  As above.   PAST SURGICAL HISTORY:  1. Perineoplasty x2.  2. Ectopic pregnancy.  3. Scar revision, right shoulder.  4. Cesarean section.   ALLERGIES:  Allergic to PAPER TAPE.  No known drug allergies.   CURRENT MEDICATIONS:  Multivitamin, ranitidine b.i.d. (unknown dosage),  and unspecified antibiotic for sinus infection.   FAMILY HISTORY:  No family history of gastrointestinal malignancy.   SOCIAL HISTORY:  The patient is married with 2 children.  She lives with  her husband and son.  She is a Futures trader.  She does not smoke.  Occasionally drinks red wine.   REVIEW OF SYSTEMS:  Per diagnostic evaluation form.   PHYSICAL EXAMINATION:  GENERAL:  Well-appearing female in no acute  distress.  She is alert and oriented.  VITAL SIGNS:  Blood pressure is 110/80, heart rate 76 and regular,  weight 165 pounds.  HEENT:  Sclerae anicteric.  Conjunctivae pink, oral mucosa intact.  No  adenopathy.  LUNGS:  Clear.  HEART:  Regular.  ABDOMEN:  Soft without tenderness, mass, or hernia.  Good bowel sounds  heard.  RECTAL:  Irritated skin around the anus as well as more proximally  toward the buttock crack.  No evidence of infection, just skin  irritation.  EXTREMITIES:  Without edema.   IMPRESSION:  1. Pruritus ani.  Irritation around the rectum as noted.  No evidence      of infection.  2. Heartburn without alarm symptoms.  3. Functional constipation.   RECOMMENDATIONS:  1. Again, reviewed the care instructions for pruritus ani.  As well,      again, recommended Desitin (not hydrocortisone) for skin      irritation.  Should  the problem persist, she should see a      dermatologist.  2. Continue ranitidine for heartburn.  She could consider Prilosec      over-the-counter 20 mg daily if she has any breakthrough heartburn      symptoms on ranitidine.  3. Fiber supplementation in the form of Metamucil 2 tablespoons daily      as well as liberal uses of water for bowels.  4. Gastroenterology followup in 6 weeks if needed.  Otherwise, p.r.n.  5. Resume general medical care with Dr. Alwyn Ren.     Wilhemina Bonito. Marina Goodell, MD  Electronically Signed    JNP/MedQ  DD: 01/01/2006  DT: 01/01/2006  Job #: [id number]   cc:   Titus Dubin. Alwyn Ren, MD,FACP,FCCP

## 2010-06-21 NOTE — Discharge Summary (Signed)
[place]  Patient:    [name], [name] Visit Number: [id number] MRN: [id number]          Service Type: MED Location: [phone] Attending Physician:  Carrie Mew Dictated by:   Brita Romp, P.A.C. Admit Date:  03/08/2001 Discharge Date: 03/11/2001   CC:         Titus Dubin. Alwyn Ren, M.D. [facility]   Discharge Summary  DISCHARGE DIAGNOSES: 1. Non-Q-wave myocardial infarction. 2. Coronary artery disease, status post cardiac catheterization with    percutaneous coronary intervention. 3. 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  HOSPITAL COURSE:  [name] is a very pleasant 55 year old female with a questionable prior history of myocardial infarction in 82.  Approximately two days prior to admission, she had brief episodes of squeezing substernal chest pain.  On the day of admission, she had an acute onset of left-sided chest pain which radiated to the arm and was heavy in nature.  She presented to [facility] [employer] emergency room where she was initially treated with Toradol and morphine.  A spiral CT of the chest was obtained which showed no pulmonary embolism or DVT.  Later that day, serial cardiac enzymes revealed an elevated troponin of 3.04 and a cardiology consult was obtained. The patient was seen by Luis Abed, M.D.  He felt that the patient had a non-Q-wave myocardial infarction and that cardiac catheterization was indicated.  On March 09, 2001, the patient was taken to the cath lab by Daisey Must, M.D.  Catheterization results: 1. Left ventriculogram:  Mild akinesis of the inferior wall and base in the    mid ventricular levels; ejection fraction of 55%.  There was known    mitral regurgitation. 2. Left main coronary artery:  Normal. 3. Left anterior descending:  Minor luminal irregularities. 4. Left circumflex:  Luminal irregularities; focal 20% stenosis in the more    lateral  branch of the ramus intermedius. 5. Right coronary artery:  Small; codominant.  Diffuse ectasia in the mid    vessel.  In the proximal vessel there was noted to be an irregular 30 to    40% stenosis.  In the mid vessel, there was a discreet 80% stenosis within    the hepatic segment of the vessel.  Dr. Gerri Spore then preceded with angioplasty and stenting to the lesion in the right coronary artery.  This was reduced down to a 0% residual with TIMI-3 flow.  He recommended continued treatment with Plavix for six to 12 months.  The next day, the patient was doing well, had no further chest pain or shortness of breath.  The patient then underwent a transthoracic echocardiogram.  Echocardiogram showed mild dilation of the left ventricle. EF was estimated between 55 and 65%.  There was noted to be some hypokinesis of the posterior wall and left atrial size was at the upper limits of normal.  The next day, the patient was doing well, had some mild shoulder pain which changed with movement.  Otherwise she was felt to be stable for discharge.  DISCHARGE MEDICATIONS: 1. Enteric coated aspirin 325 mg q.d. 2. Toprol XL 25 mg q.d. 3. Pravachol 20 mg q.h.s. 4. Plavix 75 mg q.d. 5. Nitroglycerin sublingual as needed or directed.  LAB VALUES:  Sodium 138, potassium 4.3, chloride 104, CO2 30, BUN 6, creatinine 0.7, glucose 120. White count 10.0, hemoglobin 13.1, hematocrit 38.1, platelets 249.  Total cholesterol 135, triglycerides 105, HDL 46,  LDL 68, total cholesterol:HDL ratio 2.9. ESR was 8.  The cardiac enzymes on March 09, 2002, with CK 780, MB fraction 123.3, index 15.8 and troponin I of 6.02.  Electrocardiogram showed sinus rhythm at 72, PR interval 176, QRS 98, QTC 402, axis 0. Dictated by:   Brita Romp, P.A.C. Attending Physician:  Carrie Mew DD:  03/11/01 TD:  03/12/01 Job: [id number]

## 2010-06-21 NOTE — Discharge Summary (Signed)
   [name], [place]                             ACCOUNT NO.:  [id number]   MEDICAL RECORD NO.:  [id number]                   PATIENT TYPE:  INP   LOCATION:  3727                                 FACILITY:  [facility]   PHYSICIAN:  Olga Millers, M.D. [facility]            DATE OF BIRTH:  Jun 06, 1955   DATE OF ADMISSION:  04/05/2002  DATE OF DISCHARGE:  04/06/2002                           DISCHARGE SUMMARY - REFERRING   PROCEDURE:  None.   REASON FOR ADMISSION:  Please refer to the dictated admission note.   LABORATORY DATA:  Cardiac enzymes were normal (three sets).  D-dimer 0.2.  CBC normal.  Sodium 139, potassium 3.6, glucose 111, BUN 23, creatinine 1.1.  Admission chest x-ray:  No active disease.   HOSPITAL COURSE:  The patient was admitted for further elevation of symptoms  which were felt to be atypical for ischemic heart disease.  The patient has  known coronary artery disease and had a reportedly negative recent  Cardiolite in the office.  Serial cardiac enzymes were normal.  D-dimer was  also negative.  Serial electrocardiogram tracings revealed nonspecific  abnormalities.  The patient's symptoms were felt to be pleuritic and  responsive to palpation.  She was placed on a course of nonsteroidals with  the addition of pain medication, and cleared for discharge on the following  day in hemodynamically stable condition.   MEDICATION ADJUSTMENTS THIS ADMISSION:  1. Down-titration of aspirin.  2. Resumption of all other previous home medications.   DISCHARGE MEDICATIONS:  1. Naprosyn 375 mg t.i.d. (x1 week).  2. Darvocet-N 100 1-1/2 to 2 tablets q.4-6h. p.r.n.  3. Aspirin 81 mg daily.  4. Toprol XL 25 mg daily.  5. Zocor 20 mg q.h.s.  6. Folic acid.  7. Nitrostat p.r.n.   DISCHARGE INSTRUCTIONS:  The patient is instructed to follow up with Dr.  Willa Rough, as previously scheduled.  She will return to her primary care  physician, Dr. Titus Dubin. Hopper, as needed.   DISCHARGE  DIAGNOSES:  1. Musculoskeletal chest pain     a. Normal serial cardiac markers.  2. Coronary artery disease     a. Status post non-STE  myocardial infarction/percutaneous coronary        intervention in February 2003.     b.        Status post myocardial infarction in 1992, secondary to coronary artery        dissection, treated medically.     c. History of preserved left ventricular function.  3. Dyslipidemia.     Gene Serpe, P.A. [facility]                      Olga Millers, M.D. [facility]    GS/MEDQ  D:  04/06/2002  T:  04/06/2002  Job:  [id number]   cc:   Titus Dubin. Alwyn Ren, M.D. [facility]

## 2010-06-21 NOTE — Assessment & Plan Note (Signed)
[facility]                            CARDIOLOGY OFFICE NOTE   Bridget, Forbes                        MRN:          [id number]  DATE:01/14/2006                            DOB:          03-12-1955    [name] is here for cardiology follow-up.  She does have known coronary  artery disease.  She has been stable.  She actually is exercising  significantly and not having any significant problems.  She has  significant rectal problems and sees [name]. [name], M.D.  He saw her  very recently and the dictation is not in the chart at the moment.  She  also has many difficulties with many medications.  We had a long  discussion about this.  There is some question of some type of pus  that she sees in her stool.  She thinks she gets it when she eats  shrimp, but also from Vytorin and aspirin.  [name] Forbes her on  Prilosec and this is helping her.  He also Forbes her on [name] and this  appears to be helping her.  She is going about full activities.   ALLERGIES:  As mentioned there are many medications that give her GI  symptoms that appear to possibly include STATINS and ASPIRIN, but this  has to be considered.   MEDICATIONS:  Currently she is on a multivitamin and she is on Prilosec  and [name].   PAST MEDICAL HISTORY:  See the list below.   REVIEW OF SYSTEMS:  Other than her GI rectal symptoms, she is actually  doing well.  She is extremely stressed today.  She has just heard bad  news about her father's health and he may not survive for a long period  of time.  Otherwise, her review of systems is negative.   PHYSICAL EXAMINATION:  VITAL SIGNS:  Today blood pressure is 149/92 with  pulse of 83, weight 165 pounds.  GENERAL:  The patient is oriented to person, time, and place.  Her  affect reveals that she is stressed significantly.  HEENT:  No xanthelasma.  She has normal extraocular motion.  She has no  carotid bruits.  There is no jugular  venous distention.  HEART:  S1 with S2.  There are no clicks or significant murmurs.  LUNGS:  Clear.  Respiratory effort is not labored.  ABDOMEN:  Soft.  There are no masses or bruits.  She has normal bowel  sounds.  She has 2+ distal pulses.  There is no peripheral edema.  There  are no major musculoskeletal deformities.   EKG reveals left axis deviation.  There are nonspecific ST-T wave  changes.   PROBLEMS:  1. History of cardiac catheterization in 1992.  There was question of      some type of dissection at that time in [place].  2. The patient had an acute non-ST elevation infarct in February of      2003 that was treated with a stent.  There was no proof of spasm.      She had a follow-up  Cardiolite scan in February of 2005.  She had      no chest pain on the treadmill.  There was no definite ischemia.      There was an area of inferoseptal scar.  Ejection fraction was      normal.  3. Question of some type of undiagnosed connective tissue disorder.  4. Ejection fraction of 55% with some inferior hypokinesis.  5. Hyperlipidemia. The patient has not been able recently to tolerate      her medicines because of her GI symptomatology.  6. Gastrointestinal problems including intermittent right-sided      abdominal pain in the past, minor rectal bleeding, pruritus ani,      irritated skin on the buttocks region, question of pus with      certain medicines and shrimp.   I am hopeful that she will stabilize on [name] and Prilosec.  From a  cardiac viewpoint we need to try to get her back on her cardiac  medication.  I have started by asking her to start aspirin 81 mg every  other day and then try to push it to every day.  I will then see her  back and we will try to work through other issues including lipid  therapy and any other testing that is needed for her ischemic heart  disease.     Luis Abed, MD, [facility]  Electronically Signed    JDK/MedQ  DD: 01/14/2006  DT:  01/14/2006  Job #: [id number]   cc:   Titus Dubin. Alwyn Ren, MD,FACP,FCCP

## 2010-06-26 ENCOUNTER — Other Ambulatory Visit: Payer: Self-pay | Admitting: Cardiology

## 2010-06-26 ENCOUNTER — Other Ambulatory Visit: Payer: Self-pay | Admitting: Internal Medicine

## 2010-06-26 ENCOUNTER — Telehealth: Payer: Self-pay | Admitting: Cardiology

## 2010-06-26 NOTE — Telephone Encounter (Signed)
Pt has lost 25 lbs now weighs 141lbs wants to know if med strength needs to be adjusted?

## 2010-06-26 NOTE — Telephone Encounter (Signed)
Per pt call  - states she has lost 25 lbs and was getting ready to refill her medication and wanted to know if they are dosed related to weight.  Instructed pt that is not the case with these medications and she should go on and refill them.  Pt is noted to be overdue for appointment wit hDr Myrtis Ser - she is aware and has scheduled an appointment

## 2010-06-28 ENCOUNTER — Encounter: Payer: Self-pay | Admitting: Cardiology

## 2010-06-28 DIAGNOSIS — E785 Hyperlipidemia, unspecified: Secondary | ICD-10-CM | POA: Insufficient documentation

## 2010-06-28 DIAGNOSIS — R9430 Abnormal result of cardiovascular function study, unspecified: Secondary | ICD-10-CM | POA: Insufficient documentation

## 2010-06-28 DIAGNOSIS — I2510 Atherosclerotic heart disease of native coronary artery without angina pectoris: Secondary | ICD-10-CM | POA: Insufficient documentation

## 2010-07-02 ENCOUNTER — Encounter: Payer: Self-pay | Admitting: Cardiology

## 2010-07-02 ENCOUNTER — Ambulatory Visit (INDEPENDENT_AMBULATORY_CARE_PROVIDER_SITE_OTHER): Payer: BC Managed Care – PPO | Admitting: Cardiology

## 2010-07-02 DIAGNOSIS — E785 Hyperlipidemia, unspecified: Secondary | ICD-10-CM

## 2010-07-02 DIAGNOSIS — I2510 Atherosclerotic heart disease of native coronary artery without angina pectoris: Secondary | ICD-10-CM

## 2010-07-02 MED ORDER — CLOPIDOGREL BISULFATE 75 MG PO TABS: 75.0000 mg | ORAL_TABLET | ORAL | Status: DC

## 2010-07-02 NOTE — Progress Notes (Signed)
HPI IThe patient is seen for cardiology followup.  I saw her last in February, 2011.  She has known coronary disease.  She has not had any symptoms.  She received a stent to the right coronary artery in 2003.  Catheterization in 2008 revealed 60% proximal RCA.  The stent was patent in the mid RCA.  Ejection fraction was normal.  Patient has remained on aspirin and Plavix.  She is having some increased bruisability. No Known Allergies  Current Outpatient Prescriptions  Medication Sig Dispense Refill  . ALPRAZolam (XANAX) 0.5 MG tablet Take 0.5 mg by mouth at bedtime as needed.        . clopidogrel (PLAVIX) 75 MG tablet Take 75 mg by mouth daily.        . fluticasone (FLONASE) 50 MCG/ACT nasal spray Place 2 sprays into the nose as needed.        . folic acid (FOLVITE) 1 MG tablet TAKE 1 TABLET DAILY  90 tablet  1  . Melatonin 3 MG CAPS as needed.        . Multiple Vitamin (MULTIVITAMIN) capsule Take 1 capsule by mouth daily.        . nitroGLYCERIN (NITROSTAT) 0.4 MG SL tablet Place 0.4 mg under the tongue every 5 (five) minutes as needed.        . simvastatin (ZOCOR) 20 MG tablet TAKE 1 TABLET DAILY AT BEDTIME  90 tablet  1  . triamterene-hydrochlorothiazide (MAXZIDE-25) 37.5-25 MG per tablet Take 1 tablet by mouth daily.          History   Social History  . Marital Status: Married    Spouse Name: N/A    Number of Children: N/A  . Years of Education: N/A   Occupational History  . Not on file.   Social History Main Topics  . Smoking status: Never Smoker   . Smokeless tobacco: Not on file  . Alcohol Use: Not on file  . Drug Use: Not on file  . Sexually Active: Not on file   Other Topics Concern  . Not on file   Social History Narrative  . No narrative on file    No family history on file.  Past Medical History  Diagnosis Date  . Gluten intolerance     Probable based on elimination protocol results  . Nephrolithiasis   . Gastric polyp   . CAD (coronary artery disease)     Stent-RCA 2003,/ catheterization October, 2008, 60% proximal RCA, patent stent mid RCA, medical therapy  . Ejection fraction     EF 50%, catheterization, 2008(EF 55%, echo, 2003)  . Psoriasis   . Dyslipidemia   . Tinnitus     Dr. Dorma Russell    No past surgical history on file.  ROS  Patient denies fever, chills, headache, sweats, rash, change in vision, change in hearing, chest pain, cough, nausea vomiting, urinary symptoms.  All other systems are reviewed and are negative.  PHYSICAL EXAM Patient is quite stable.  She has lost weight since her last visit.  She is following the Weight Watchers program and doing very well.  She is oriented to person time and place.  Affect is normal.  Head is atraumatic.  There are no carotid bruits.  There is no jugular venous distention.  Lungs are clear.  Respiratory effort is not labored.  Cardiac exam reveals an S1-S2.  No clicks or significant murmurs.  Abdomen is soft.  There is no peripheral edema.  There are no skin rashes.  There are no musculoskeletal deformities. Filed Vitals:   07/02/10 1400  BP: 122/80  Pulse: 66  Resp: 14  Height: 5\' 5"  (1.651 m)  Weight: 142 lb (64.411 kg)    EKG is Done today and reviewed by me.  I have compared to the tracing 2011.  There is nonspecific ST-T wave abnormalities.  There is no significant change.  ASSESSMENT & PLAN

## 2010-07-02 NOTE — Assessment & Plan Note (Signed)
Coronary disease is very stable.  I reviewed the fact that the patient's last study was 2008.  She's having no symptoms.  I am not recommending any exercise testing at this point.  With some increased bruisability she and I discussed the pros and cons of Plavix.  We've decided that she will start taking it every other day as the effect of the drug is 5 days.  She is stable.  I'll see her back in one year.

## 2010-07-02 NOTE — Assessment & Plan Note (Signed)
The patient is on medication.  We would want her LDL to be a goal in the range of 70-80.  She will be seeing her primary physician for followup yearly physical and labs in the near future.

## 2010-07-02 NOTE — Patient Instructions (Signed)
Decrease Plavix to every other day Your physician wants you to follow-up in: 1 year. You will receive a reminder letter in the mail two months in advance. If you don't receive a letter, please call our office to schedule the follow-up appointment.

## 2010-10-18 ENCOUNTER — Telehealth: Payer: Self-pay | Admitting: *Deleted

## 2010-10-18 NOTE — Telephone Encounter (Signed)
Called patient and left message on voicemail for her to call and schedule Colon.

## 2010-11-14 LAB — COMPREHENSIVE METABOLIC PANEL
Calcium: 9.2
Chloride: 104
Creatinine, Ser: 0.63
GFR calc Af Amer: >60
Glucose, Bld: 104 — ABNORMAL HIGH
Total Bilirubin: 0.8
Total Protein: 7.2

## 2010-11-14 LAB — BASIC METABOLIC PANEL
BUN: 15
CO2: 28
Chloride: 104
Creatinine, Ser: 0.7
Glucose, Bld: 96

## 2010-11-14 LAB — I-STAT 8, (EC8 V) (CONVERTED LAB)
Acid-base deficit: 1
Chloride: 105
Hemoglobin: 15.6 — ABNORMAL HIGH
TCO2: 27
pH, Ven: 7.363 — ABNORMAL HIGH

## 2010-11-14 LAB — DIFFERENTIAL
Basophils Absolute: 0.1
Basophils Relative: 1
Eosinophils Relative: 2
Lymphs Abs: 3

## 2010-11-14 LAB — CK TOTAL AND CKMB (NOT AT ARMC)
CK, MB: 1.7
Relative Index: INVALID
Relative Index: INVALID

## 2010-11-14 LAB — CBC
HCT: 41.5
Hemoglobin: 14.6
MCV: 92.7
Platelets: 269

## 2010-11-14 LAB — LIPID PANEL
Cholesterol: 189
HDL: 35 — ABNORMAL LOW
LDL Cholesterol: 128 — ABNORMAL HIGH
Total CHOL/HDL Ratio: 5.4
Triglycerides: 130

## 2010-11-14 LAB — POCT I-STAT CREATININE: Creatinine, Ser: 0.8

## 2010-11-14 LAB — TROPONIN I
Troponin I: <0.01
Troponin I: <0.01

## 2010-11-14 LAB — POCT CARDIAC MARKERS: Operator id: 198171

## 2010-11-14 LAB — PROTIME-INR: Prothrombin Time: 12.5

## 2010-11-21 LAB — COMPREHENSIVE METABOLIC PANEL
ALT: 29
AST: 20
Calcium: 9.2
Creatinine, Ser: 0.69
GFR calc Af Amer: >60
Sodium: 138
Total Protein: 7.3

## 2010-11-21 LAB — POCT CARDIAC MARKERS
Myoglobin, poc: 91.9
Myoglobin, poc: 93.2
Operator id: 151321
Operator id: 272551
Troponin i, poc: <0.05

## 2010-11-21 LAB — DIFFERENTIAL
Eosinophils Absolute: 0.2
Lymphocytes Relative: 34
Lymphs Abs: 2.8
Monocytes Relative: 11
Neutrophils Relative %: 53

## 2010-11-21 LAB — CBC
MCHC: 34.2
MCV: 93.3
RDW: 13.2

## 2011-01-13 ENCOUNTER — Other Ambulatory Visit: Payer: Self-pay | Admitting: Cardiology

## 2011-01-13 ENCOUNTER — Other Ambulatory Visit: Payer: Self-pay | Admitting: Internal Medicine

## 2011-04-02 ENCOUNTER — Telehealth: Payer: Self-pay | Admitting: *Deleted

## 2011-04-02 NOTE — Telephone Encounter (Signed)
Called home number [phone] and cell # [phone] and left messages regarding her colonoscopy was due 2011 and advised to call us regarding scheduling her next colonoscopy.

## 2011-04-08 ENCOUNTER — Other Ambulatory Visit: Payer: Self-pay | Admitting: Obstetrics and Gynecology

## 2011-04-08 DIAGNOSIS — R928 Other abnormal and inconclusive findings on diagnostic imaging of breast: Secondary | ICD-10-CM

## 2011-04-11 ENCOUNTER — Ambulatory Visit
Admission: RE | Admit: 2011-04-11 | Discharge: 2011-04-11 | Disposition: A | Discharge status: Home / Self Care | Payer: BC Managed Care – PPO | Source: Ambulatory Visit | Attending: Obstetrics and Gynecology | Admitting: Obstetrics and Gynecology

## 2011-04-11 DIAGNOSIS — R928 Other abnormal and inconclusive findings on diagnostic imaging of breast: Secondary | ICD-10-CM

## 2011-04-12 ENCOUNTER — Other Ambulatory Visit: Payer: Self-pay | Admitting: Internal Medicine

## 2011-04-12 ENCOUNTER — Other Ambulatory Visit (HOSPITAL_COMMUNITY): Payer: Self-pay | Admitting: Internal Medicine

## 2011-04-14 ENCOUNTER — Other Ambulatory Visit: Payer: Self-pay

## 2011-04-14 MED ORDER — SIMVASTATIN 20 MG PO TABS: 20.0000 mg | ORAL_TABLET | Freq: Every day | ORAL | Status: DC

## 2011-04-14 NOTE — Telephone Encounter (Signed)
Prescription sent to pharmacy.  Patient needs appointment.

## 2011-05-10 ENCOUNTER — Ambulatory Visit (INDEPENDENT_AMBULATORY_CARE_PROVIDER_SITE_OTHER): Payer: BC Managed Care – PPO | Admitting: Family Medicine

## 2011-05-10 VITALS — BP 134/77 | HR 86 | Temp 100.0°F | Resp 16 | Ht 66.0 in | Wt 146.0 lb

## 2011-05-10 DIAGNOSIS — R05 Cough: Secondary | ICD-10-CM

## 2011-05-10 DIAGNOSIS — R059 Cough, unspecified: Secondary | ICD-10-CM

## 2011-05-10 DIAGNOSIS — R509 Fever, unspecified: Secondary | ICD-10-CM

## 2011-05-10 MED ORDER — DOXYCYCLINE HYCLATE 100 MG PO CAPS: 100.0000 mg | ORAL_CAPSULE | Freq: Two times a day (BID) | ORAL | Status: AC

## 2011-05-10 MED ORDER — ALBUTEROL SULFATE (2.5 MG/3ML) 0.083% IN NEBU: 2.5000 mg | INHALATION_SOLUTION | Freq: Four times a day (QID) | RESPIRATORY_TRACT | Status: AC | PRN

## 2011-05-10 MED ORDER — HYDROCODONE-HOMATROPINE 5-1.5 MG/5ML PO SYRP: 5.0000 mL | ORAL_SOLUTION | Freq: Three times a day (TID) | ORAL | Status: AC | PRN

## 2011-05-10 NOTE — Progress Notes (Signed)
Patient Name: [name] Date of Birth: 12/26/1955 Medical Record Number: [id number] Gender: female Date of Encounter: 05/10/2011  History of Present Illness:  [name] is a 56 y.o. very pleasant female patient who presents with the following:  Here today with illness for the last 4 days.  She did some yard work earlier this week and dug up some bushes- through she was having allergies when her symptoms developed.  Sinus congestion started first, then went to her chest right away.  She has a non- productive cough and some wheezing.  She is having chills and aches, did not know that she had a fever.    No GI symptoms  She notes that this does not feel like the CP she has when she had an MI.  She did use some leftover albuterol that belonged to her son -it did seem to help and she would like some more if possible.    Went dancing last night  Patient Active Problem List  Diagnoses  . GASTRIC POLYP  . BENIGN NEOPLASM OTH&UNSPEC SITE DIGESTIVE SYSTEM  . EUSTACHIAN TUBE DYSFUNCTION, LEFT  . LOSS, HEARING NOS  . URI  . NEPHROLITHIASIS  . PSORIASIS  . OSTEOPENIA  . SKIN CANCER, HX OF  . CAD (coronary artery disease)  . Ejection fraction  . Dyslipidemia   Past Medical History  Diagnosis Date  . Gluten intolerance     Probable based on elimination protocol results  . Nephrolithiasis   . Gastric polyp   . CAD (coronary artery disease)     Stent-RCA 2003,/ catheterization October, 2008, 60% proximal RCA, patent stent mid RCA, medical therapy  . Ejection fraction     EF 50%, catheterization, 2008(EF 55%, echo, 2003)  . Psoriasis   . Dyslipidemia   . Tinnitus     Dr. Dorma Russell   No past surgical history on file. History  Substance Use Topics  . Smoking status: Never Smoker   . Smokeless tobacco: Not on file  . Alcohol Use: 0.0 oz/week     social   No family history on file. No Known Allergies  Medication list has been reviewed and updated.  Review of Systems: As per  HPI- otherwise negative.   Physical Examination: Filed Vitals:   05/10/11 1604  BP: 134/77  Pulse: 86  Temp: 100 F (37.8 C)  TempSrc: Oral  Resp: 16  Height: 5\' 6"  (1.676 m)  Weight: 146 lb (66.225 kg)    Body mass index is 23.56 kg/(m^2).  GEN: WDWN, NAD, Non-toxic, A & O x 3 HEENT: Atraumatic, Normocephalic. Neck supple. No masses, No LAD.  Tm, oropharynx wnl Ears and Nose: No external deformity. CV: RRR, No M/G/R. No JVD. No thrill. No extra heart sounds. PULM: CTA B, no wheezes, crackles, rhonchi. No retractions. No resp. distress. No accessory muscle use. ABD: S, NT, ND, +BS. No rebound. No HSM. EXTR: No c/c/e NEURO Normal gait.  PSYCH: Normally interactive. Conversant. Not depressed or anxious appearing.  Calm demeanor.    Assessment and Plan: 1. Cough  albuterol (PROVENTIL) (2.5 MG/3ML) 0.083% nebulizer solution, HYDROcodone-homatropine (HYCODAN) 5-1.5 MG/5ML syrup  2. Fever  doxycycline (VIBRAMYCIN) 100 MG capsule   likely viral infection and Cathren is not eager to use antibiotics if they are not absolutely necessary. Gave Rx for albuterol neb and some cough syrup- doxy rx to hold.  She may start the doxy if she is not getting better in the next few days.  Call if she has any  concerns, gets worse, or does not improve with the doxy if she ends up using it.

## 2011-08-29 ENCOUNTER — Encounter: Payer: Self-pay | Admitting: Internal Medicine

## 2011-11-13 ENCOUNTER — Ambulatory Visit (INDEPENDENT_AMBULATORY_CARE_PROVIDER_SITE_OTHER): Payer: BC Managed Care – PPO | Admitting: Family Medicine

## 2011-11-13 VITALS — BP 174/71 | HR 73 | Temp 98.2°F | Resp 16 | Ht 66.0 in | Wt 147.0 lb

## 2011-11-13 DIAGNOSIS — I2510 Atherosclerotic heart disease of native coronary artery without angina pectoris: Secondary | ICD-10-CM

## 2011-11-13 DIAGNOSIS — J069 Acute upper respiratory infection, unspecified: Secondary | ICD-10-CM

## 2011-11-13 DIAGNOSIS — J019 Acute sinusitis, unspecified: Secondary | ICD-10-CM

## 2011-11-13 DIAGNOSIS — I10 Essential (primary) hypertension: Secondary | ICD-10-CM

## 2011-11-13 DIAGNOSIS — F430 Acute stress reaction: Secondary | ICD-10-CM

## 2011-11-13 MED ORDER — AMOXICILLIN 500 MG PO CAPS: 500.0000 mg | ORAL_CAPSULE | Freq: Three times a day (TID) | ORAL | Status: DC

## 2011-11-13 MED ORDER — IPRATROPIUM BROMIDE 0.06 % NA SOLN: 2.0000 | Freq: Three times a day (TID) | NASAL | Status: DC

## 2011-11-13 MED ORDER — PREDNISONE 20 MG PO TABS
ORAL_TABLET | ORAL | Status: DC
Start: 2011-11-13 — End: 2012-01-19

## 2011-11-13 NOTE — Progress Notes (Signed)
Patient ID: [name] MRN: [id number], DOB: September 24, 1955, 56 y.o. Date of Encounter: 11/13/2011, 9:20 PM  Primary Physician: Marga Melnick, MD  Chief Complaint:  Chief Complaint  Patient presents with  . Sinusitis    HPI: 56 y.o. year old female presents with a four day history of nasal congestion, post nasal drip, sore throat, and sinus pressure. Afebrile. No chills. Nasal congestion thick and green/yellow. Sinus pressure is the worst symptom. No cough. No wheezing, shortness of breath, dyspnea, palpitations, edema, or tachycardia. Ears feel full, leading to sensation of muffled hearing. Has tried OTC cold preps without success. No GI complaints. Appetite normal.   No recent antibiotics, recent travels, or sick contacts   No leg trauma, sedentary periods, h/o cancer, or tobacco use.  She is flying to [place] on 11/14/11 for 16 days as her husband is out of work and he is looking for a job and has an interview in [place]. She would rather not take antibiotics, but would like to have some supportive care. This trip is causing her some stress as her father has been ill recently with two hospitalizations.  She last saw Dr. Myrtis Ser for her CAD in  06/2010 and was instructed to follow up in one year. She has yet to make that appointment. She does state that she has contacted him and that he stated, "I do not care if you take Plavix, or don't, there's no data out there for someone like you." Patient has not and is not having any chest pain, shortness of breath, dyspnea, cough, edema, wheezing, palpitations, or tachycardia. She does state that she is comfortable with the decision that she is "not feeding me a line." She does know that she is over due for her cardiology follow up and does plan to follow up with this. She also knows that she needs to be on her cholesterol medication, Simvastatin, which she currently is not.   She also informs me of the tragic news that a neighbor's child down  the street from her passed away at the age of 46 just the previous night. She was a little tearful speaking of this as it brought back some memories of one of her daughter's friends that also passed away at a similar age years ago. We did not discuss this topic in detail this evening.    Past Medical History  Diagnosis Date  . Gluten intolerance     Probable based on elimination protocol results  . Nephrolithiasis   . Gastric polyp   . CAD (coronary artery disease)     Stent-RCA 2003,/ catheterization October, 2008, 60% proximal RCA, patent stent mid RCA, medical therapy  . Ejection fraction     EF 50%, catheterization, 2008(EF 55%, echo, 2003)  . Psoriasis   . Dyslipidemia   . Tinnitus     Dr. Dorma Russell     Home Meds: Prior to Admission medications   Medication Sig Start Date End Date Taking? Authorizing Provider  albuterol (PROVENTIL) (2.5 MG/3ML) 0.083% nebulizer solution Take 3 mLs (2.5 mg total) by nebulization every 6 (six) hours as needed for wheezing. 05/10/11 05/09/12 No Gwenlyn Found Copland, MD  ALPRAZolam (XANAX) 0.5 MG tablet Take 0.5 mg by mouth at bedtime as needed.     No Historical Provider, MD         clopidogrel (PLAVIX) 75 MG tablet TAKE 1 TABLET DAILY 01/13/11  No Luis Abed, MD  fluticasone [facility]) 50 MCG/ACT nasal spray Place 2 sprays  into the nose as needed.     No Historical Provider, MD  folic acid (FOLVITE) 1 MG tablet TAKE 1 TABLET DAILY 04/12/11  No Pecola Lawless, MD         Melatonin 3 MG CAPS as needed.     No Historical Provider, MD  Multiple Vitamin (MULTIVITAMIN) capsule Take 1 capsule by mouth daily.     No Historical Provider, MD  nitroGLYCERIN (NITROSTAT) 0.4 MG SL tablet Place 0.4 mg under the tongue every 5 (five) minutes as needed.     No Historical Provider, MD         simvastatin (ZOCOR) 20 MG tablet Take 1 tablet (20 mg total) by mouth at bedtime. 04/14/11  No Dolores Patty, MD  triamterene-hydrochlorothiazide (MAXZIDE-25) 37.5-25 MG per  tablet Take 1 tablet by mouth daily.     No Historical Provider, MD    Allergies: No Known Allergies  History   Social History  . Marital Status: Married    Spouse Name: N/A    Number of Children: N/A  . Years of Education: N/A   Occupational History  . Not on file.   Social History Main Topics  . Smoking status: Never Smoker   . Smokeless tobacco: Not on file  . Alcohol Use: 0.0 oz/week     social  . Drug Use: Not on file  . Sexually Active: Not on file   Other Topics Concern  . Not on file   Social History Narrative  . No narrative on file     Review of Systems: Constitutional: negative for chills, fever, night sweats or weight changes Cardiovascular: negative for chest pain or palpitations Respiratory: negative for hemoptysis, wheezing, or shortness of breath Abdominal: negative for abdominal pain, nausea, vomiting or diarrhea Dermatological: negative for rash Neurologic: negative for headache Psych: No depression, anhedonia, SI, or HI   Physical Exam: Blood pressure 174/71, pulse 73, temperature 98.2 F (36.8 C), temperature source Oral, resp. rate 16, height 5\' 6"  (1.676 m), weight 147 lb (66.679 kg), SpO2 97.00%., Body mass index is 23.73 kg/(m^2). Blood pressure recheck 162/88. General: Well developed, well nourished, in no acute distress. Patient briefly tearful in room when she was taking about all of the stress she had been under. She was very grateful to have me there to listen.  Head: Normocephalic, atraumatic, eyes without discharge, sclera non-icteric, nares are congested. Bilateral auditory canals clear, TM's are without perforation, pearly grey with reflective cone of light bilaterally. Serous effusion bilaterally behind TM's. Mild bilateral maxillary sinus TTP. Oral cavity moist, dentition normal. Posterior pharynx with post nasal drip and mild erythema. No peritonsillar abscess or tonsillar exudate. Neck: Supple. No thyromegaly. Full ROM. No  lymphadenopathy. Lungs: Clear bilaterally to auscultation without wheezes, rales, or rhonchi. Breathing is unlabored.  Heart: RRR with S1 S2. No murmurs, rubs, or gallops appreciated. Msk:  Strength and tone normal for age. Extremities: No clubbing or cyanosis. No edema. Neuro: Alert and oriented X 3. Moves all extremities spontaneously. CNII-XII grossly in tact. Psych:  Responds to questions appropriately with a normal affect. No SI/HI. In good spirits.      ASSESSMENT AND PLAN:  56 y.o. year old female with URI, HTN, CAD, and acute stress reaction 1. URI -Prednisone 20 mg 2 po daily for 5 days #10 no RF SED -Atrovent NS 0.06% 2 sprays each nare bid prn #1 no RF -Amoxicillin 500 mg 1 po tid #30 no RF, take if not improving in 1 week  while out of town -Can wear mask while flying to help prevent URI -Can have air blowing down to create barrier on plane  -Mucinex -Tylenol/Motrin prn -Rest/fluids -RTC precautions  2.CAD -Patient states that Dr. Myrtis Ser has told her it is ok for her to not take Plavix. "I am not feeding you a line."  -I have advised her to contact his office to discuss this and to follow up with him as she is over due for this, she agrees to do so. -Patient without any chest pain, SOB, wheezing, dyspnea, edema, palpitations, tachycardia, or cough -ER precautions given  3. HTN -Previously well controlled -Patient is under a considerable amount of stress lately with her husband being out of work, him having a job interview in [place], her dad being hospitalized twice, and her neighbor down the street child dying at age 64 the previous night. She is in good spirits, just stressed. No SI or HI. She is not depressed. She does have some Xanax at home and I have advised her that tonight might be a good night to go ahead and take one of those tabs.  -She does have a blood pressure cuff at home and will check this at home, she will also check this while she is in [place] for  the next 16 days. She is to follow up with [place] or Dr. Myrtis Ser office when she is back in town to address her HTN, she agrees to schedule this appointment.  -She is to discuss with [place] or Dr. Myrtis Ser restarting her cholesterol medication, she agrees to this -ER precautions given   4. Acute stress reaction -She unfortunately has had quite a bit of stress in her life lately. She has done remarkably well with all of this. I feel like a change of scenery by going out to [place] will benefit her. She also has some Xanax at home. I have advised her that tonight might be a good night to go ahead and take one of those tabs. I have instructed her that if she feels like her stress level is continuing to increase she can call me at [facility] anytime. I told I feel like this is just a short term issue and things will correct themselves in time. She is not exhibiting any signs of depression, SI, or HI.   5. Patient discussed with Dr. Neva Seat, agrees with above assessment and plan.    Signed, Eula Listen, PA-C 11/13/2011 9:20 PM

## 2011-11-18 NOTE — Progress Notes (Signed)
patient discussed with Ryan Dunn, PAC. Agree with assessment and plan of care per his note.   

## 2011-12-07 ENCOUNTER — Other Ambulatory Visit: Payer: Self-pay | Admitting: Internal Medicine

## 2011-12-09 NOTE — Telephone Encounter (Signed)
Per Dr.Hopper #90/Appointment

## 2012-01-19 ENCOUNTER — Ambulatory Visit (INDEPENDENT_AMBULATORY_CARE_PROVIDER_SITE_OTHER): Payer: BC Managed Care – PPO | Admitting: Internal Medicine

## 2012-01-19 ENCOUNTER — Encounter: Payer: Self-pay | Admitting: Internal Medicine

## 2012-01-19 VITALS — BP 130/86 | HR 89 | Temp 98.2°F | Wt 149.2 lb

## 2012-01-19 DIAGNOSIS — H9190 Unspecified hearing loss, unspecified ear: Secondary | ICD-10-CM

## 2012-01-19 DIAGNOSIS — Z8669 Personal history of other diseases of the nervous system and sense organs: Secondary | ICD-10-CM

## 2012-01-19 DIAGNOSIS — J309 Allergic rhinitis, unspecified: Secondary | ICD-10-CM

## 2012-01-19 DIAGNOSIS — R030 Elevated blood-pressure reading, without diagnosis of hypertension: Secondary | ICD-10-CM

## 2012-01-19 DIAGNOSIS — R51 Headache: Secondary | ICD-10-CM

## 2012-01-19 MED ORDER — FLUTICASONE PROPIONATE 50 MCG/ACT NA SUSP: 1.0000 | Freq: Two times a day (BID) | NASAL | Status: AC | PRN

## 2012-01-19 MED ORDER — METOPROLOL TARTRATE 25 MG PO TABS: 25.0000 mg | ORAL_TABLET | Freq: Two times a day (BID) | ORAL | Status: AC

## 2012-01-19 NOTE — Progress Notes (Signed)
Subjective:    Patient ID: [name], female    DOB: March 28, 1955, 56 y.o.   MRN: [id number]  HPI  Her blood pressure has been elevated at her gynecologist office and also at the urgent care facility. She believes that the systolic blood pressure was in the 140s. At her ENT visit the systolic blood pressure was 160. She is unable to provide the diastolic pressures as she h and as not been recording themThe blood pressure tends to be lowest in the early morning.  She denies epistaxis, chest pain, palpitations, dyspnea, edema, or claudication.  Since October she's been having headaches which typically occur in the morning. They're in the retro-orbital area on the right.. There described as dull to throbbing. The headaches can last hours;these do respond to Tylenol. They disappeared while she was on prednisone for benign positional vertigo.  Her ENT specialist questions a Mnire's variant    Review of Systems   Symptoms associated with headache: Nausea/vomiting: slight nausea  Photophobia/phonophobia: some light sensitivity but she has astigmatism Tearing of eyes:some  Sinus pain/pressure: frontally ; ? Related to outside allergens   Family hx migraine: daughter  Fever: no Neck pain/stiffness: no related Vision/speech/swallow/hearing difficulty: diplopia rarely in inferior visual field  Focal weakness/numbness: no  Altered mental status: no Anticoagulant use:on Plavix until 6 months ago         Objective:   Physical Exam Gen.:  well-nourished in appearance. Alert, appropriate and cooperative throughout exam. Head: Normocephalic without obvious abnormalities Eyes: No corneal or conjunctival inflammation noted. FOV normal. Extraocular motion intact. Vision grossly normal. Ears: External  ear exam reveals no significant lesions or deformities. Canals clear .TMs normal. Hearing is grossly decreased bilaterally. Nose: External nasal exam reveals no deformity or inflammation. Nasal  mucosa are pink and moist. No lesions or exudates noted.  Mouth: Oral mucosa and oropharynx reveal no lesions or exudates. Teeth in good repair. Neck: No deformities, masses, or tenderness noted. Range of motion normal. Lungs: Normal respiratory effort; chest expands symmetrically. Lungs are clear to auscultation without rales, wheezes, or increased work of breathing. Heart: Normal rate and rhythm. Normal S1 and S2. No gallop, click, or rub. S4 w/o murmur.                                                                           Musculoskeletal/extremities:  No clubbing, cyanosis, edema, or deformity noted. Tone & strength  normal.Joints normal. Nail health  good. Vascular: Carotid & radial artery pulses are full and equal. No bruits present. Neurologic: Alert and oriented x3. Deep tendon reflexes symmetrical and normal. No cranial nerve deficit. Finger to nose and Romberg testing normal         Skin: Intact without suspicious lesions or rashes. Lymph: No cervical, axillary lymphadenopathy present. Psych: Mood and affect are normal. Normally interactive  Assessment & Plan:    #1 elevated blood pressure without diagnosis of hypertension  #2 right retro-orbital headaches without neurologic deficit. Rare diplopia by history  #3 Mnire's variant  #4 history of benign positional vertigo  #5 hearing loss  Plan: She will set up an appointment for an ophthalmologic evaluation

## 2012-01-19 NOTE — Patient Instructions (Addendum)
Please keep a diary of your headaches . Document  each occurrence on the calendar with notation of : #1 any prodrome ( any non headache symptom such as marked fatigue,visual changes, ,etc ) which precedes actual headache ; #2) severity on 1-10 scale; #3) any triggers ( food/ drink,enviromenntal or weather changes ,physical or emotional stress) in 8-12 hour period prior to the headache; & #4) response to any medications or other intervention. Please review "Headache" @ [facility] for additional information.    Plain Mucinex for thick secretions ;force NON dairy fluids . Use a Neti pot daily as needed for sinus congestion; going from open side to congested side . Nasal cleansing in the shower as discussed. Make sure that all residual soap is removed to prevent irritation. Fluticasone 1 spray in each nostril twice a day as needed. Use the "crossover" technique as discussed.     Blood Pressure Goal  Ideally is an AVERAGE < 135/85. This AVERAGE should be calculated from @ least 5-7 BP readings taken @ different times of day on different days of week. You should not respond to isolated BP readings , but rather the AVERAGE for that week . If you activate My Chart; the results can be released to you as soon as they populate from the lab. If you choose not to use this program; the labs have to be reviewed, copied & mailed   causing a delay in getting the results to you.

## 2012-01-20 ENCOUNTER — Telehealth: Payer: Self-pay

## 2012-01-20 NOTE — Telephone Encounter (Signed)
Message left on voicemail: Please call ( no additional information given)  Patient would like to share information with Dr.Hopper, she read-up on Maxide (B/P medication) and notice that it can cause Headaches and also notice that potassium should be monitored, which she has not had. Patient states medication is rx'ed by Dr.Kraus. I asked patient is she contacted Dr.Kraus with her concern and she stated No! Patient contacted our office since she thinks medication is related to problems that she was recently seen for. Patient stated she would like to share this with Dr.Hopper as a FYI.  Patient also with eye doctor appointment tomorrow Lorain Childes)  Message forwarded to Dr.Hopper for review

## 2012-01-25 ENCOUNTER — Other Ambulatory Visit: Payer: Self-pay | Admitting: Internal Medicine

## 2012-01-25 DIAGNOSIS — R930 Abnormal findings on diagnostic imaging of skull and head, not elsewhere classified: Secondary | ICD-10-CM

## 2012-01-25 DIAGNOSIS — R519 Headache, unspecified: Secondary | ICD-10-CM

## 2012-01-25 DIAGNOSIS — Z8669 Personal history of other diseases of the nervous system and sense organs: Secondary | ICD-10-CM

## 2012-01-26 ENCOUNTER — Ambulatory Visit (INDEPENDENT_AMBULATORY_CARE_PROVIDER_SITE_OTHER)
Admission: RE | Admit: 2012-01-26 | Discharge: 2012-01-26 | Disposition: A | Discharge status: Home / Self Care | Payer: BC Managed Care – PPO | Source: Ambulatory Visit | Attending: Internal Medicine | Admitting: Internal Medicine

## 2012-01-26 DIAGNOSIS — R930 Abnormal findings on diagnostic imaging of skull and head, not elsewhere classified: Secondary | ICD-10-CM

## 2012-01-26 DIAGNOSIS — R519 Headache, unspecified: Secondary | ICD-10-CM

## 2012-01-26 DIAGNOSIS — J320 Chronic maxillary sinusitis: Secondary | ICD-10-CM

## 2012-01-26 DIAGNOSIS — Z8669 Personal history of other diseases of the nervous system and sense organs: Secondary | ICD-10-CM

## 2012-01-26 DIAGNOSIS — R51 Headache: Secondary | ICD-10-CM

## 2012-01-26 NOTE — Telephone Encounter (Signed)
Based on Dr Lorelle Formosa 's findings ; I have ordered a CT of the head       Patient aware of Dr.Hopper statement. FYI B/P is good on Average

## 2012-02-05 ENCOUNTER — Telehealth: Payer: Self-pay | Admitting: Internal Medicine

## 2012-02-05 DIAGNOSIS — R51 Headache: Secondary | ICD-10-CM

## 2012-02-05 NOTE — Telephone Encounter (Signed)
Referral placed for Headache clinic, message to be forwarded to [facility] at [facility]. Patient would rather see another MD

## 2012-02-05 NOTE — Telephone Encounter (Signed)
Per Dr.Hopper ok to place referral marked as urgent. I called Myriam Jacobson at [facility] to inform her of referral status

## 2012-02-05 NOTE — Telephone Encounter (Signed)
Patient called back and indicated she researched MD and he is New. Patient states she would like to see someone else, patient states if that means waiting several weeks she is ok with that. Dr.Hopper please advise and route message to [facility] at [facility] (referral coordinator)

## 2012-02-05 NOTE — Telephone Encounter (Signed)
Patient states she is still having headaches and would like referral to neurology ASAP. CB# [phone]

## 2012-02-05 NOTE — Telephone Encounter (Signed)
Patient states she does not want to see Dr. Smiley Houseman because he is new. She is requesting to see someone who Dr. Alwyn Ren knows and is familiar with, outside of [place]. Please advise.

## 2012-02-05 NOTE — Telephone Encounter (Signed)
Left message on voicemail, per Dr.Hopper if patient would like to see another Neurologist it will be several weeks before she is seen. Patient to return call and ok that she is willing to wait several weeks (for previous message stated ASAP)

## 2012-02-05 NOTE — Telephone Encounter (Signed)
Refer to Headache Clinic ; Dr Smiley Houseman is in [place] [facility] , only Neuro group in [place]

## 2012-02-11 ENCOUNTER — Ambulatory Visit: Payer: BC Managed Care – PPO | Admitting: Neurology

## 2012-07-03 ENCOUNTER — Other Ambulatory Visit: Payer: Self-pay | Admitting: Internal Medicine

## 2014-05-31 ENCOUNTER — Telehealth: Payer: Self-pay | Admitting: Cardiology

## 2014-05-31 NOTE — Telephone Encounter (Signed)
Request faxed to Cath lab @ [phone] for cath to be put on CD and interoffice to me.  Release is attached to cath request. 4.27.16/km

## 2014-06-02 ENCOUNTER — Telehealth: Payer: Self-pay | Admitting: Cardiology

## 2014-06-02 NOTE — Telephone Encounter (Signed)
Cd of Cath received from Hospital mailed to Pt address [place] 4.29.16/km

## 2014-08-04 IMAGING — CT CT HEAD W/O CM
1 series · 16 of 30 positions shown, 20 images · non-contrast
Comparison: MR 08/18/2006

CLINICAL DATA: Headaches

CT HEAD WITHOUT CONTRAST
TECHNIQUE: Contiguous axial images were obtained from the base of
the skull through the vertex without contrast.

[Series 2: head_seq -c 4.5 h37s st · axial · 0.43mm/px · z∈[-169,-25]mm · 16 of 36 slices shown, 20 images]
[im 2/36  brain]
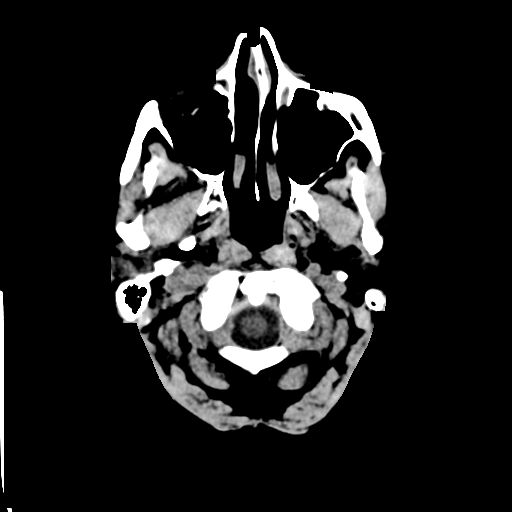
[im 2/36  bone]
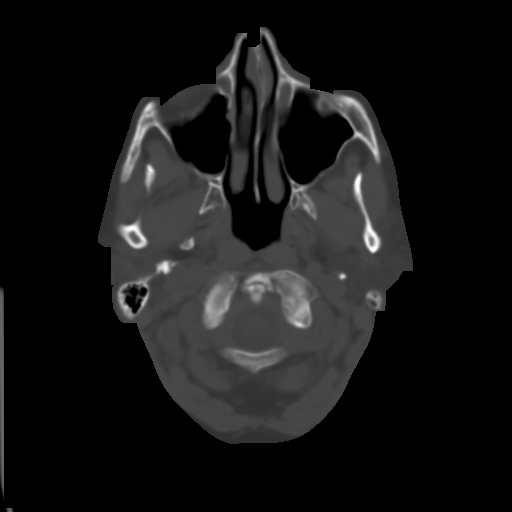
[im 4/36  brain]
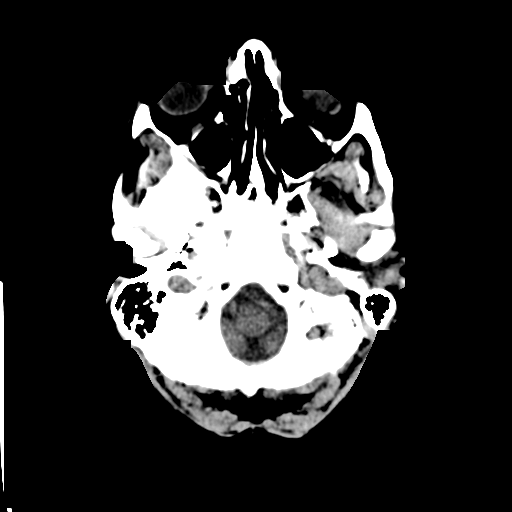
[im 7/36  brain]
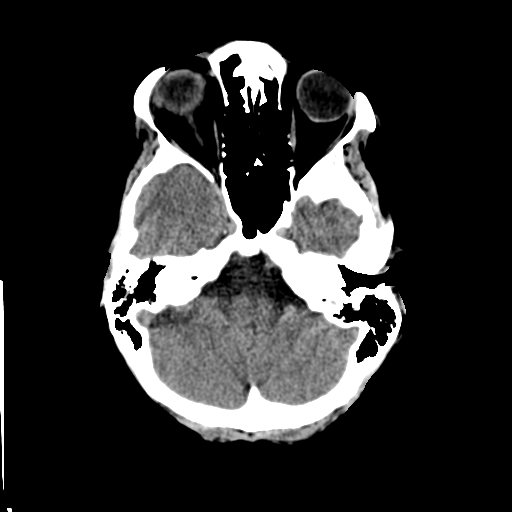
[im 9/36  brain]
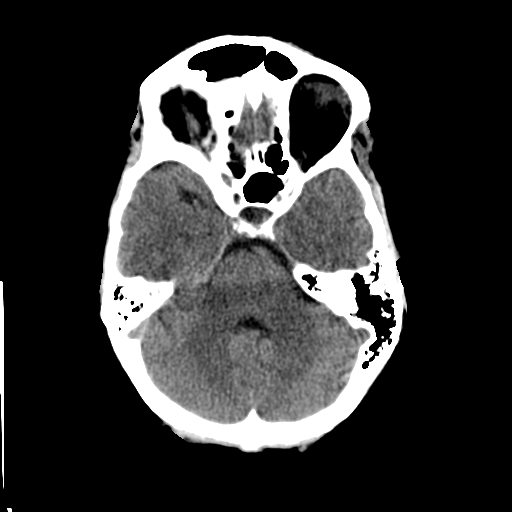
[im 10/36  brain]
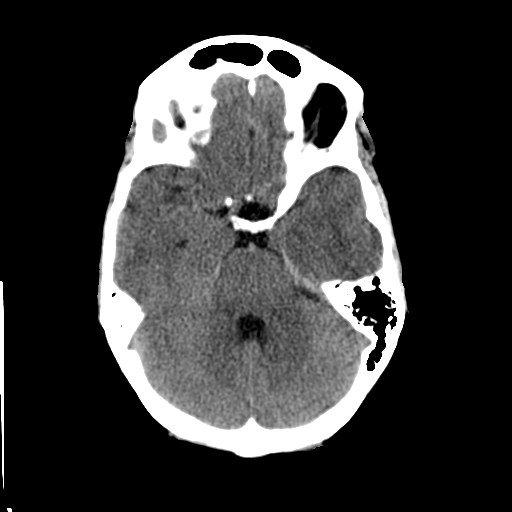
[im 10/36  bone]
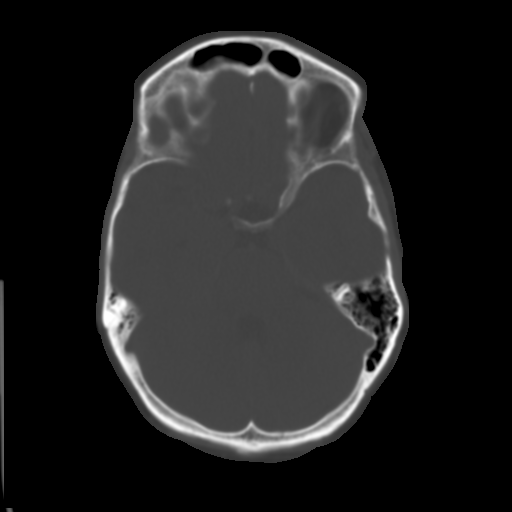
[im 13/36  brain]
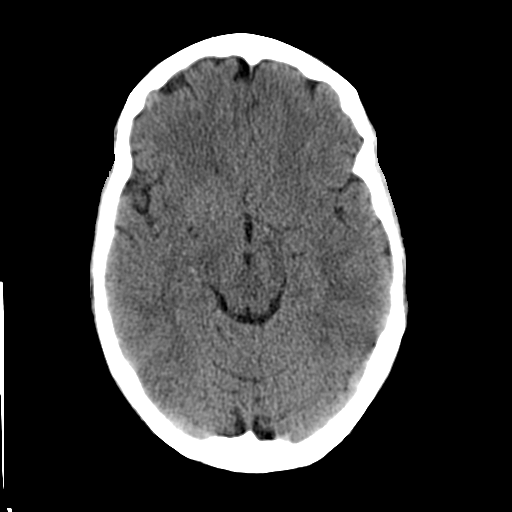
[im 15/36  brain]
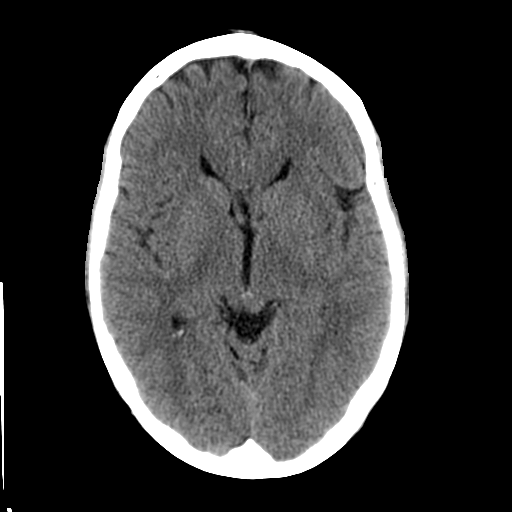
[im 17/36  brain]
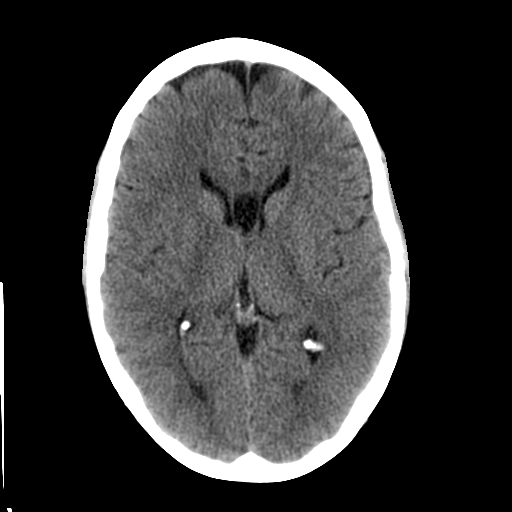
[im 19/36  brain]
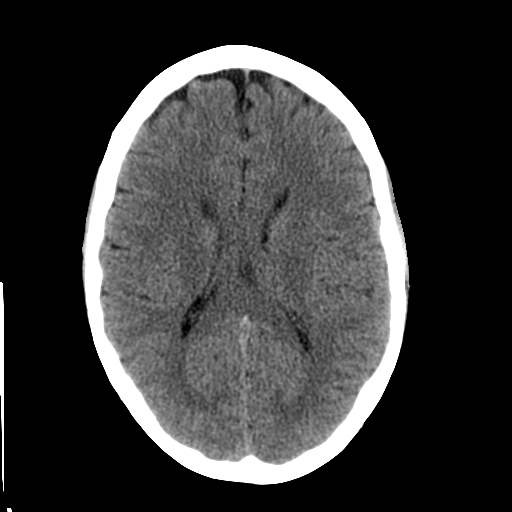
[im 19/36  bone]
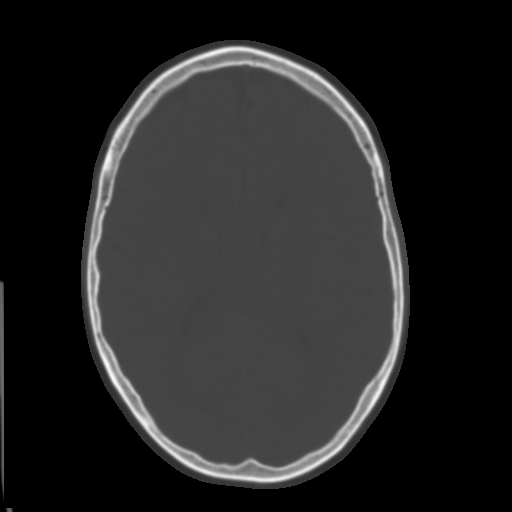
[im 21/36  brain]
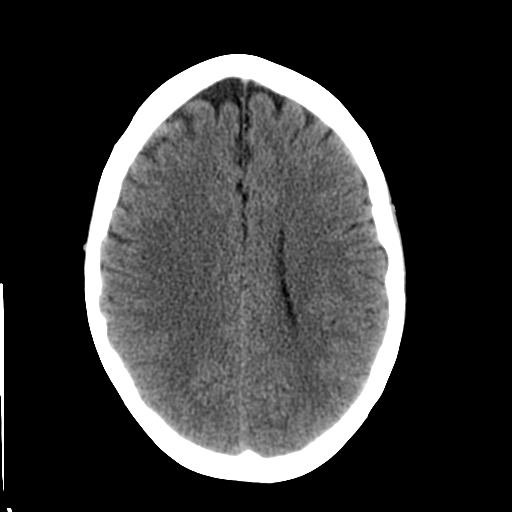
[im 23/36  brain]
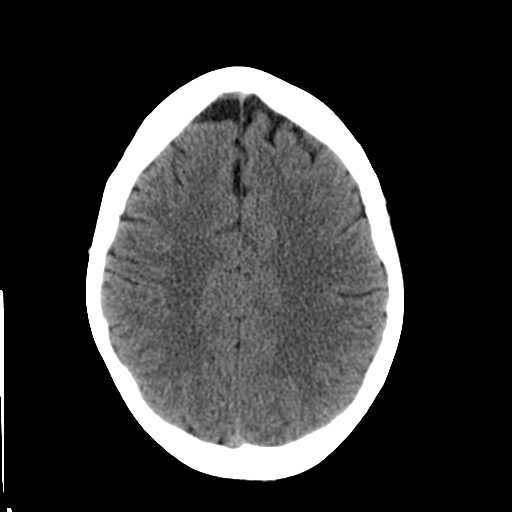
[im 26/36  brain]
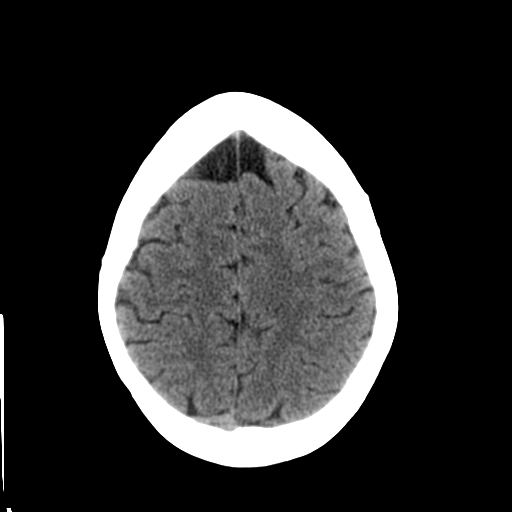
[im 27/36  brain]
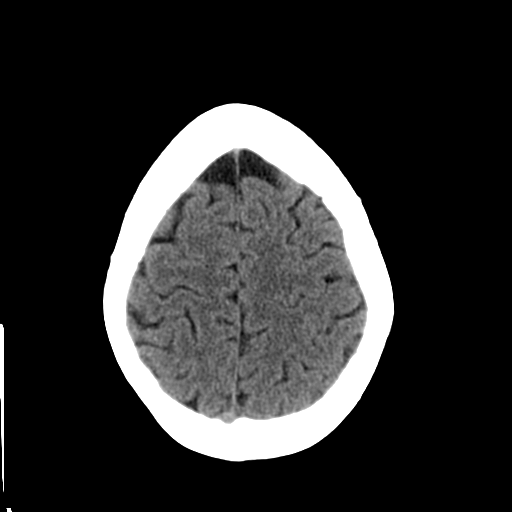
[im 27/36  bone]
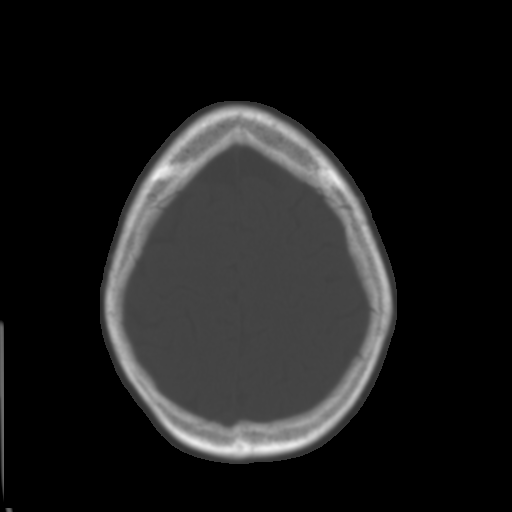
[im 29/36  brain]
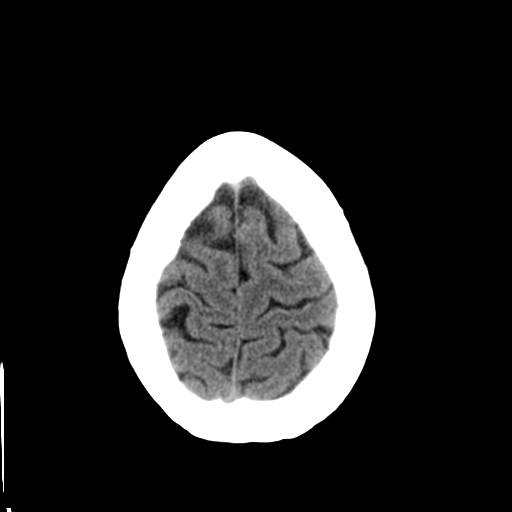
[im 32/36  brain]
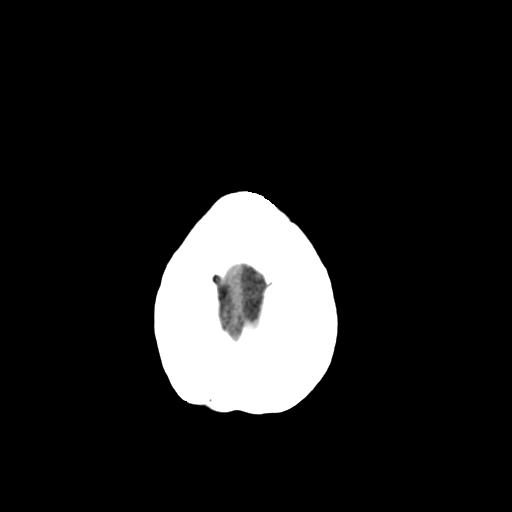
[im 34/36  brain]
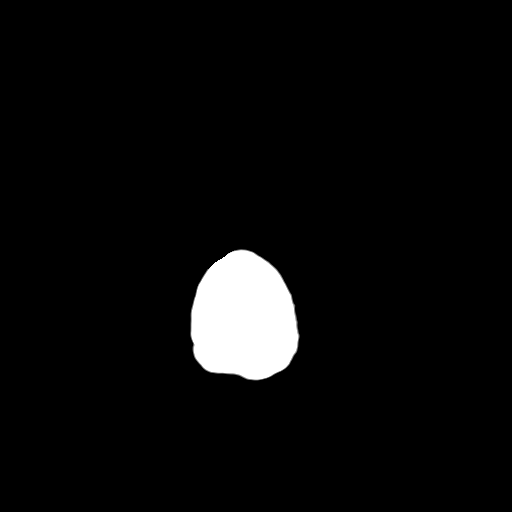

[16 of 30 positions shown; findings below may reference images not displayed]

FINDINGS: Cavum septum pellucidum. There is no evidence of acute
intracranial hemorrhage, brain edema, mass lesion, acute
infarction,   mass effect, or midline shift. Acute infarct may be
inapparent on noncontrast CT.  No other intra-axial abnormalities
are seen, and the ventricles and sulci are within normal limits in
size and symmetry.   No abnormal extra-axial fluid collections or
masses are identified.  No significant calvarial abnormality.
IMPRESSION: 1. Negative for bleed or other acute intracranial process.
